# Patient Record
Sex: Female | Born: 1965 | Race: White | Hispanic: No | Marital: Married | Smoking: Never smoker
Health system: Southern US, Community
[De-identification: ages and names within clinical notes are randomized; demographics above are authoritative.]

## PROBLEM LIST (undated history)

## (undated) DIAGNOSIS — M19072 Primary osteoarthritis, left ankle and foot: Secondary | ICD-10-CM

## (undated) DIAGNOSIS — I8390 Asymptomatic varicose veins of unspecified lower extremity: Secondary | ICD-10-CM

## (undated) DIAGNOSIS — Z973 Presence of spectacles and contact lenses: Secondary | ICD-10-CM

## (undated) DIAGNOSIS — M1990 Unspecified osteoarthritis, unspecified site: Secondary | ICD-10-CM

## (undated) DIAGNOSIS — E7800 Pure hypercholesterolemia, unspecified: Secondary | ICD-10-CM

## (undated) DIAGNOSIS — M5126 Other intervertebral disc displacement, lumbar region: Secondary | ICD-10-CM

## (undated) DIAGNOSIS — G4730 Sleep apnea, unspecified: Secondary | ICD-10-CM

## (undated) DIAGNOSIS — I872 Venous insufficiency (chronic) (peripheral): Secondary | ICD-10-CM

## (undated) DIAGNOSIS — Q2112 Patent foramen ovale: Secondary | ICD-10-CM

## (undated) DIAGNOSIS — M25562 Pain in left knee: Secondary | ICD-10-CM

## (undated) DIAGNOSIS — I890 Lymphedema, not elsewhere classified: Secondary | ICD-10-CM

## (undated) DIAGNOSIS — R011 Cardiac murmur, unspecified: Secondary | ICD-10-CM

## (undated) DIAGNOSIS — D271 Benign neoplasm of left ovary: Secondary | ICD-10-CM

## (undated) DIAGNOSIS — I639 Cerebral infarction, unspecified: Secondary | ICD-10-CM

## (undated) DIAGNOSIS — K219 Gastro-esophageal reflux disease without esophagitis: Secondary | ICD-10-CM

## (undated) DIAGNOSIS — Z8719 Personal history of other diseases of the digestive system: Secondary | ICD-10-CM

## (undated) DIAGNOSIS — E559 Vitamin D deficiency, unspecified: Secondary | ICD-10-CM

## (undated) HISTORY — PX: DIAGNOSTIC LAPAROSCOPY: SUR761

## (undated) HISTORY — PX: ABDOMINAL HYSTERECTOMY: SHX81

## (undated) HISTORY — PX: NOVASURE ABLATION: SHX5394

---

## 1983-02-27 HISTORY — PX: TONSILLECTOMY: SUR1361

## 1991-02-27 HISTORY — PX: CHOLECYSTECTOMY: SHX55

## 2005-04-06 ENCOUNTER — Ambulatory Visit: Payer: Self-pay | Admitting: Family Medicine

## 2006-04-26 ENCOUNTER — Ambulatory Visit: Payer: Self-pay | Admitting: Obstetrics and Gynecology

## 2007-04-29 ENCOUNTER — Ambulatory Visit: Payer: Self-pay | Admitting: Obstetrics and Gynecology

## 2008-03-15 ENCOUNTER — Ambulatory Visit: Payer: Self-pay | Admitting: Obstetrics and Gynecology

## 2008-03-23 ENCOUNTER — Ambulatory Visit: Payer: Self-pay | Admitting: Obstetrics and Gynecology

## 2008-04-30 ENCOUNTER — Ambulatory Visit: Payer: Self-pay | Admitting: Obstetrics and Gynecology

## 2009-05-02 ENCOUNTER — Ambulatory Visit: Payer: Self-pay | Admitting: Obstetrics and Gynecology

## 2009-05-07 ENCOUNTER — Ambulatory Visit: Payer: Self-pay | Admitting: Obstetrics and Gynecology

## 2009-05-23 ENCOUNTER — Ambulatory Visit: Payer: Self-pay | Admitting: Obstetrics and Gynecology

## 2010-05-05 ENCOUNTER — Ambulatory Visit: Payer: Self-pay | Admitting: Obstetrics and Gynecology

## 2010-05-21 ENCOUNTER — Ambulatory Visit: Payer: Self-pay | Admitting: Bariatrics

## 2010-05-28 ENCOUNTER — Ambulatory Visit: Payer: Self-pay | Admitting: Cardiovascular Disease

## 2010-06-04 ENCOUNTER — Ambulatory Visit: Payer: Self-pay | Admitting: Bariatrics

## 2010-06-28 ENCOUNTER — Ambulatory Visit: Payer: Self-pay | Admitting: Bariatrics

## 2010-07-21 ENCOUNTER — Ambulatory Visit: Payer: Self-pay | Admitting: Internal Medicine

## 2010-09-28 HISTORY — PX: ESOPHAGOGASTRIC FUNDOPLASTY: SUR458

## 2010-12-28 HISTORY — PX: LAPAROSCOPIC GASTRIC SLEEVE RESECTION: SHX5895

## 2011-05-07 ENCOUNTER — Ambulatory Visit: Payer: Self-pay | Admitting: Obstetrics and Gynecology

## 2012-05-12 ENCOUNTER — Ambulatory Visit: Payer: Self-pay | Admitting: Obstetrics and Gynecology

## 2013-04-05 HISTORY — PX: HAMMER TOE SURGERY: SHX385

## 2013-05-16 DIAGNOSIS — E559 Vitamin D deficiency, unspecified: Secondary | ICD-10-CM | POA: Insufficient documentation

## 2013-05-18 ENCOUNTER — Ambulatory Visit: Payer: Self-pay | Admitting: Family Medicine

## 2014-03-22 DIAGNOSIS — R011 Cardiac murmur, unspecified: Secondary | ICD-10-CM | POA: Insufficient documentation

## 2014-04-30 ENCOUNTER — Ambulatory Visit: Payer: Self-pay | Admitting: Unknown Physician Specialty

## 2014-04-30 HISTORY — PX: COLONOSCOPY: SHX174

## 2014-05-01 LAB — PATHOLOGY REPORT

## 2014-05-22 ENCOUNTER — Ambulatory Visit: Payer: Self-pay | Admitting: Family Medicine

## 2014-05-31 ENCOUNTER — Ambulatory Visit: Payer: Self-pay | Admitting: Family Medicine

## 2015-01-18 ENCOUNTER — Ambulatory Visit
Admit: 2015-01-18 | Disposition: A | Discharge status: Home / Self Care | Payer: Self-pay | Attending: Family Medicine | Admitting: Family Medicine

## 2015-01-18 LAB — URINALYSIS, COMPLETE
Bilirubin,UR: NEGATIVE
Glucose,UR: NEGATIVE
NITRITE: NEGATIVE
PH: 5.5 (ref 5.0–8.0)

## 2015-02-11 DIAGNOSIS — M67479 Ganglion, unspecified ankle and foot: Secondary | ICD-10-CM | POA: Insufficient documentation

## 2015-02-11 DIAGNOSIS — M2041 Other hammer toe(s) (acquired), right foot: Secondary | ICD-10-CM | POA: Insufficient documentation

## 2015-03-26 ENCOUNTER — Other Ambulatory Visit: Payer: Self-pay | Admitting: Family Medicine

## 2015-03-26 DIAGNOSIS — M79662 Pain in left lower leg: Secondary | ICD-10-CM

## 2015-03-28 ENCOUNTER — Ambulatory Visit
Admission: RE | Admit: 2015-03-28 | Discharge: 2015-03-28 | Disposition: A | Discharge status: Home / Self Care | Payer: BC Managed Care – PPO | Source: Ambulatory Visit | Attending: Family Medicine | Admitting: Family Medicine

## 2015-03-28 ENCOUNTER — Ambulatory Visit: Payer: Self-pay

## 2015-03-28 DIAGNOSIS — M79662 Pain in left lower leg: Secondary | ICD-10-CM

## 2015-03-28 DIAGNOSIS — M79605 Pain in left leg: Secondary | ICD-10-CM | POA: Diagnosis not present

## 2015-05-29 ENCOUNTER — Other Ambulatory Visit: Payer: Self-pay | Admitting: Family Medicine

## 2015-05-29 DIAGNOSIS — Z1231 Encounter for screening mammogram for malignant neoplasm of breast: Secondary | ICD-10-CM

## 2015-06-04 DIAGNOSIS — M25562 Pain in left knee: Secondary | ICD-10-CM | POA: Insufficient documentation

## 2015-06-04 DIAGNOSIS — M1712 Unilateral primary osteoarthritis, left knee: Secondary | ICD-10-CM | POA: Insufficient documentation

## 2015-06-11 ENCOUNTER — Other Ambulatory Visit: Payer: Self-pay | Admitting: Orthopedic Surgery

## 2015-06-11 DIAGNOSIS — M25562 Pain in left knee: Secondary | ICD-10-CM

## 2015-06-18 ENCOUNTER — Ambulatory Visit
Admission: RE | Admit: 2015-06-18 | Discharge: 2015-06-18 | Disposition: A | Discharge status: Home / Self Care | Payer: BC Managed Care – PPO | Source: Ambulatory Visit | Attending: Family Medicine | Admitting: Family Medicine

## 2015-06-18 DIAGNOSIS — Z1231 Encounter for screening mammogram for malignant neoplasm of breast: Secondary | ICD-10-CM | POA: Insufficient documentation

## 2015-06-20 ENCOUNTER — Ambulatory Visit
Admission: RE | Admit: 2015-06-20 | Discharge: 2015-06-20 | Disposition: A | Discharge status: Home / Self Care | Payer: BC Managed Care – PPO | Source: Ambulatory Visit | Attending: Orthopedic Surgery | Admitting: Orthopedic Surgery

## 2015-06-20 DIAGNOSIS — S83232A Complex tear of medial meniscus, current injury, left knee, initial encounter: Secondary | ICD-10-CM | POA: Diagnosis not present

## 2015-06-20 DIAGNOSIS — X58XXXA Exposure to other specified factors, initial encounter: Secondary | ICD-10-CM | POA: Diagnosis not present

## 2015-06-20 DIAGNOSIS — M25562 Pain in left knee: Secondary | ICD-10-CM | POA: Insufficient documentation

## 2015-06-20 DIAGNOSIS — M1711 Unilateral primary osteoarthritis, right knee: Secondary | ICD-10-CM | POA: Diagnosis not present

## 2015-07-03 DIAGNOSIS — S83209A Unspecified tear of unspecified meniscus, current injury, unspecified knee, initial encounter: Secondary | ICD-10-CM | POA: Insufficient documentation

## 2015-11-12 ENCOUNTER — Encounter: Payer: Self-pay | Admitting: *Deleted

## 2015-11-22 ENCOUNTER — Encounter
Admission: RE | Disposition: A | Discharge status: Home / Self Care | Payer: Self-pay | Source: Ambulatory Visit | Attending: Unknown Physician Specialty

## 2015-11-22 ENCOUNTER — Ambulatory Visit: Payer: BC Managed Care – PPO | Admitting: Anesthesiology

## 2015-11-22 ENCOUNTER — Ambulatory Visit
Admission: RE | Admit: 2015-11-22 | Discharge: 2015-11-22 | Disposition: A | Discharge status: Home / Self Care | Payer: BC Managed Care – PPO | Source: Ambulatory Visit | Attending: Unknown Physician Specialty | Admitting: Unknown Physician Specialty

## 2015-11-22 DIAGNOSIS — Z882 Allergy status to sulfonamides status: Secondary | ICD-10-CM | POA: Insufficient documentation

## 2015-11-22 DIAGNOSIS — Z9889 Other specified postprocedural states: Secondary | ICD-10-CM | POA: Diagnosis not present

## 2015-11-22 DIAGNOSIS — Z79899 Other long term (current) drug therapy: Secondary | ICD-10-CM | POA: Insufficient documentation

## 2015-11-22 DIAGNOSIS — E559 Vitamin D deficiency, unspecified: Secondary | ICD-10-CM | POA: Diagnosis not present

## 2015-11-22 DIAGNOSIS — M948X6 Other specified disorders of cartilage, lower leg: Secondary | ICD-10-CM | POA: Insufficient documentation

## 2015-11-22 DIAGNOSIS — Z9049 Acquired absence of other specified parts of digestive tract: Secondary | ICD-10-CM | POA: Insufficient documentation

## 2015-11-22 DIAGNOSIS — M1712 Unilateral primary osteoarthritis, left knee: Secondary | ICD-10-CM | POA: Diagnosis not present

## 2015-11-22 DIAGNOSIS — Z887 Allergy status to serum and vaccine status: Secondary | ICD-10-CM | POA: Insufficient documentation

## 2015-11-22 DIAGNOSIS — R011 Cardiac murmur, unspecified: Secondary | ICD-10-CM | POA: Diagnosis not present

## 2015-11-22 DIAGNOSIS — Z841 Family history of disorders of kidney and ureter: Secondary | ICD-10-CM | POA: Diagnosis not present

## 2015-11-22 DIAGNOSIS — M19072 Primary osteoarthritis, left ankle and foot: Secondary | ICD-10-CM | POA: Insufficient documentation

## 2015-11-22 DIAGNOSIS — S83242A Other tear of medial meniscus, current injury, left knee, initial encounter: Secondary | ICD-10-CM | POA: Diagnosis present

## 2015-11-22 DIAGNOSIS — Z8249 Family history of ischemic heart disease and other diseases of the circulatory system: Secondary | ICD-10-CM | POA: Insufficient documentation

## 2015-11-22 DIAGNOSIS — Z803 Family history of malignant neoplasm of breast: Secondary | ICD-10-CM | POA: Diagnosis not present

## 2015-11-22 HISTORY — DX: Presence of spectacles and contact lenses: Z97.3

## 2015-11-22 HISTORY — DX: Cardiac murmur, unspecified: R01.1

## 2015-11-22 HISTORY — DX: Unspecified osteoarthritis, unspecified site: M19.90

## 2015-11-22 HISTORY — PX: KNEE ARTHROSCOPY: SHX127

## 2015-11-22 SURGERY — ARTHROSCOPY, KNEE
Anesthesia: General | Site: Knee | Laterality: Left | Wound class: Clean

## 2015-11-22 MED ORDER — MIDAZOLAM HCL 5 MG/5ML IJ SOLN
INTRAMUSCULAR | Status: DC | PRN
  Administered 2015-11-22: 2 mg via INTRAVENOUS

## 2015-11-22 MED ORDER — LIDOCAINE HCL (CARDIAC) 20 MG/ML IV SOLN
INTRAVENOUS | Status: DC | PRN
  Administered 2015-11-22: 30 mg via INTRATRACHEAL

## 2015-11-22 MED ORDER — NORCO 5-325 MG PO TABS: 1.0000 | ORAL_TABLET | Freq: Four times a day (QID) | ORAL | Status: DC | PRN

## 2015-11-22 MED ORDER — BUPIVACAINE HCL (PF) 0.5 % IJ SOLN
INTRAMUSCULAR | Status: DC | PRN
  Administered 2015-11-22: 20 mL

## 2015-11-22 MED ORDER — PROPOFOL 10 MG/ML IV BOLUS
INTRAVENOUS | Status: DC | PRN
  Administered 2015-11-22: 200 mg via INTRAVENOUS

## 2015-11-22 MED ORDER — OXYCODONE HCL 5 MG/5ML PO SOLN
5.0000 mg | Freq: Once | ORAL | Status: DC | PRN
Start: 2015-11-22 — End: 2015-11-22

## 2015-11-22 MED ORDER — OXYCODONE HCL 5 MG PO TABS: 5.0000 mg | ORAL_TABLET | Freq: Once | ORAL | Status: DC | PRN

## 2015-11-22 MED ORDER — PROMETHAZINE HCL 25 MG/ML IJ SOLN: 6.2500 mg | INTRAMUSCULAR | Status: DC | PRN

## 2015-11-22 MED ORDER — FENTANYL CITRATE (PF) 100 MCG/2ML IJ SOLN: 25.0000 ug | INTRAMUSCULAR | Status: DC | PRN

## 2015-11-22 MED ORDER — FENTANYL CITRATE (PF) 100 MCG/2ML IJ SOLN
INTRAMUSCULAR | Status: DC | PRN
  Administered 2015-11-22 (×4): 50 ug via INTRAVENOUS

## 2015-11-22 MED ORDER — LACTATED RINGERS IV SOLN
INTRAVENOUS | Status: DC
  Administered 2015-11-22: 07:00:00 via INTRAVENOUS

## 2015-11-22 MED ORDER — KETOROLAC TROMETHAMINE 15 MG/ML IJ SOLN
INTRAMUSCULAR | Status: DC | PRN
  Administered 2015-11-22: 30 mg via INTRAVENOUS

## 2015-11-22 MED ORDER — DEXAMETHASONE SODIUM PHOSPHATE 4 MG/ML IJ SOLN
INTRAMUSCULAR | Status: DC | PRN
  Administered 2015-11-22: 4 mg via INTRAVENOUS

## 2015-11-22 MED ORDER — ONDANSETRON HCL 4 MG/2ML IJ SOLN
INTRAMUSCULAR | Status: DC | PRN
  Administered 2015-11-22: 4 mg via INTRAVENOUS

## 2015-11-22 SURGICAL SUPPLY — 43 items
ARTHROWAND PARAGON T2 (SURGICAL WAND)
BLADE ABRADER 4.5 (BLADE) ×3 IMPLANT
BLADE FULL RADIUS 3.5 (BLADE) IMPLANT
BLADE SHAVER 4.5X7 STR FR (MISCELLANEOUS) ×3 IMPLANT
BLADE SHAVER AGGRES 5.5  STR (CUTTER)
BLADE SHAVER AGGRES 5.5 STR (CUTTER) IMPLANT
BNDG ESMARK 6X12 TAN STRL LF (GAUZE/BANDAGES/DRESSINGS) IMPLANT
BUR 5.5 NOTCHBLASTER STR (BURR) IMPLANT
BUR ABRADER 4.0 W/FLUTE AQUA (MISCELLANEOUS) IMPLANT
BUR ABRADER 5.5 BLK (MISCELLANEOUS) IMPLANT
BUR ACROMIONIZER 4.0 (BURR) IMPLANT
BUR BR 5.5 WIDE MOUTH (BURR) IMPLANT
BURR 5.5 NOTCHBLASTER STR (BURR)
BURR ABRADER 4.0 W/FLUTE AQUA (MISCELLANEOUS)
BURR ABRADER 5.5 BLK (MISCELLANEOUS)
COVER LIGHT HANDLE UNIVERSAL (MISCELLANEOUS) ×6 IMPLANT
CUFF TOURN SGL QUICK 34 (TOURNIQUET CUFF) ×2
CUFF TRNQT CYL 34X4X40X1 (TOURNIQUET CUFF) ×1 IMPLANT
DRAPE LEGGINS SURG 28X43 STRL (DRAPES) ×3 IMPLANT
DURAPREP 26ML APPLICATOR (WOUND CARE) ×3 IMPLANT
GAUZE SPONGE 4X4 12PLY STRL (GAUZE/BANDAGES/DRESSINGS) ×3 IMPLANT
GLOVE BIO SURGEON STRL SZ7.5 (GLOVE) ×6 IMPLANT
GLOVE BIO SURGEON STRL SZ8 (GLOVE) ×3 IMPLANT
GLOVE INDICATOR 8.0 STRL GRN (GLOVE) ×6 IMPLANT
GOWN STRL REIN 2XL XLG LVL4 (GOWN DISPOSABLE) ×6 IMPLANT
GOWN STRL REUS W/TWL 2XL LVL3 (GOWN DISPOSABLE) IMPLANT
IV LACTATED RINGER IRRG 3000ML (IV SOLUTION) ×4
IV LR IRRIG 3000ML ARTHROMATIC (IV SOLUTION) ×2 IMPLANT
KIT ROOM TURNOVER OR (KITS) ×3 IMPLANT
MANIFOLD 4PT FOR NEPTUNE1 (MISCELLANEOUS) ×3 IMPLANT
PACK ARTHROSCOPY KNEE (MISCELLANEOUS) ×3 IMPLANT
SET TUBE SUCT SHAVER OUTFL 24K (TUBING) ×3 IMPLANT
SOL PREP PVP 2OZ (MISCELLANEOUS) ×3
SOLUTION PREP PVP 2OZ (MISCELLANEOUS) ×1 IMPLANT
SUT ETHILON 3-0 FS-10 30 BLK (SUTURE) ×3
SUTURE EHLN 3-0 FS-10 30 BLK (SUTURE) ×1 IMPLANT
TAPE MICROFOAM 4IN (TAPE) ×3 IMPLANT
TUBING ARTHRO INFLOW-ONLY STRL (TUBING) ×3 IMPLANT
WAND ARTHRO PARAGON T2 (SURGICAL WAND) IMPLANT
WAND HAND CNTRL MULTIVAC 50 (MISCELLANEOUS) IMPLANT
WAND HAND CNTRL MULTIVAC 90 (MISCELLANEOUS) IMPLANT
WAND MEGAVAC 90 (MISCELLANEOUS) IMPLANT
WRAP KNEE W/COLD PACKS 25.5X14 (SOFTGOODS) ×3 IMPLANT

## 2015-11-22 NOTE — Anesthesia Postprocedure Evaluation (Signed)
Anesthesia Post Note  Patient: Shari Parks  Procedure(s) Performed: Procedure(s) (LRB): ARTHROSCOPY KNEE WITH CHONDROPLASTY MEDIAL FEMORAL CONDYLE (Left)  Patient location during evaluation: PACU Anesthesia Type: General Level of consciousness: awake and alert Pain management: pain level controlled Vital Signs Assessment: post-procedure vital signs reviewed and stable Respiratory status: spontaneous breathing, nonlabored ventilation, respiratory function stable and patient connected to nasal cannula oxygen Cardiovascular status: blood pressure returned to baseline and stable Postop Assessment: no signs of nausea or vomiting Anesthetic complications: no    Arleen Bar C

## 2015-11-22 NOTE — Op Note (Signed)
Patient: Shari Parks, Shari Parks  Preoperative diagnosis: Torn medial meniscus left knee plus medial compartment chondral changes  Postop diagnosis: Large grade 4 chondral lesion medial femoral condyle left knee  Operation: Arthroscopic chondroplasty left knee  Surgeon: Vilinda Flake, MD  Anesthesia: Gen.   History: Patient's had a long history of left knee pain.  The plain films revealed minimal medial compartment narrowing .  The patient had an MRI which revealed probable torn medial meniscus and medial compartment chondral thinning.The patient was scheduled for surgery due to persistent discomfort despite conservative treatment.  The patient was taken the operating room where satisfactory general anesthesia was achieved. A tourniquet and leg holder were was applied to the left thigh. A well leg support was applied to the nonoperative extremity. The left knee was prepped and draped in usual fashion for an arthroscopic procedure. An inflow cannula was introduced superomedially. The joint was distended with lactated Ringer's. Scope was introduced through an inferolateral puncture wound and a probe through an inferomedial puncture wound. Inspection of the medial compartment revealed  basically a grade 4 medial femoral chondral lesion that involved the medial one half of the medial femoral condyle. I went ahead and debrided the edges of the chondral lesion with a small full radius synovial resector. I did probe the medial meniscus and no significant tear was appreciated. There was a little irregularity of the posterior horn of the medial meniscus, however. Inspection of the intercondylar notch revealed intact cruciates. Inspection of the the lateral compartment revealed no significant meniscal or chondral pathology.   Trochlear groove was inspected and appeared to be fairly smooth.  Retropatellar surface was smooth.The patella seemed to track fairly well.  The instruments were removed from the joint  at this time. The puncture wounds were closed with 3-0 nylon in vertical mattress fashion. I injected each puncture wound with several cc of half percent Marcaine without epinephrine. Betadine was applied the wounds followed by sterile dressing. An ice pack was applied to the right knee. The patient was awakened and transferred to the stretcher bed. The patient was taken to the recovery room in satisfactory condition.  The tourniquet was not inflated during the course of the procedure. Blood loss was negligible.

## 2015-11-22 NOTE — Anesthesia Preprocedure Evaluation (Signed)
Anesthesia Evaluation  Patient identified by MRN, date of birth, ID band Patient awake    Reviewed: Allergy & Precautions, NPO status , Patient's Chart, lab work & pertinent test results  Airway Mallampati: II  TM Distance: >3 FB Neck ROM: Full    Dental no notable dental hx.    Pulmonary neg pulmonary ROS,    Pulmonary exam normal breath sounds clear to auscultation       Cardiovascular Normal cardiovascular exam+ Valvular Problems/Murmurs  Rhythm:Regular Rate:Normal + Systolic murmurs Heart murmur "slight and followed by PCP"   Neuro/Psych negative neurological ROS  negative psych ROS   GI/Hepatic negative GI ROS, Neg liver ROS,   Endo/Other  negative endocrine ROS  Renal/GU negative Renal ROS  negative genitourinary   Musculoskeletal  (+) Arthritis , Osteoarthritis,    Abdominal   Peds negative pediatric ROS (+)  Hematology negative hematology ROS (+)   Anesthesia Other Findings   Reproductive/Obstetrics negative OB ROS                             Anesthesia Physical Anesthesia Plan  ASA: II  Anesthesia Plan: General   Post-op Pain Management:    Induction: Intravenous  Airway Management Planned: LMA  Additional Equipment:   Intra-op Plan:   Post-operative Plan: Extubation in OR  Informed Consent: I have reviewed the patients History and Physical, chart, labs and discussed the procedure including the risks, benefits and alternatives for the proposed anesthesia with the patient or authorized representative who has indicated his/her understanding and acceptance.   Dental advisory given  Plan Discussed with: CRNA  Anesthesia Plan Comments:         Anesthesia Quick Evaluation

## 2015-11-22 NOTE — H&P (Signed)
  H and P reviewed. No changes. Uploaded at later date. 

## 2015-11-22 NOTE — Anesthesia Procedure Notes (Signed)
Procedure Name: LMA Insertion Performed by: Nat Christen Pre-anesthesia Checklist: Patient identified, Patient being monitored, Timeout performed, Emergency Drugs available and Suction available Patient Re-evaluated:Patient Re-evaluated prior to inductionOxygen Delivery Method: Circle system utilized Preoxygenation: Pre-oxygenation with 100% oxygen Intubation Type: IV induction Ventilation: Mask ventilation without difficulty LMA: LMA inserted LMA Size: 4.0 Tube type: Oral Number of attempts: 1 Placement Confirmation: positive ETCO2 and breath sounds checked- equal and bilateral Tube secured with: Tape Dental Injury: Teeth and Oropharynx as per pre-operative assessment

## 2015-11-22 NOTE — Transfer of Care (Signed)
Immediate Anesthesia Transfer of Care Note  Patient: Shari Parks  Procedure(s) Performed: Procedure(s) with comments: ARTHROSCOPY KNEE WITH CHONDROPLASTY MEDIAL FEMORAL CONDYLE (Left) - order of cases moved per CeCe and Dr. Leslye Peer  Patient Location: PACU  Anesthesia Type: General  Level of Consciousness: awake, alert  and patient cooperative  Airway and Oxygen Therapy: Patient Spontanous Breathing and Patient connected to supplemental oxygen  Post-op Assessment: Post-op Vital signs reviewed, Patient's Cardiovascular Status Stable, Respiratory Function Stable, Patent Airway and No signs of Nausea or vomiting  Post-op Vital Signs: Reviewed and stable  Complications: No apparent anesthesia complications

## 2015-11-22 NOTE — Discharge Instructions (Signed)
General Anesthesia, Adult, Care After °Refer to this sheet in the next few weeks. These instructions provide you with information on caring for yourself after your procedure. Your health care provider may also give you more specific instructions. Your treatment has been planned according to current medical practices, but problems sometimes occur. Call your health care provider if you have any problems or questions after your procedure. °WHAT TO EXPECT AFTER THE PROCEDURE °After the procedure, it is typical to experience: °· Sleepiness. °· Nausea and vomiting. °HOME CARE INSTRUCTIONS °· For the first 24 hours after general anesthesia: °¨ Have a responsible person with you. °¨ Do not drive a car. If you are alone, do not take public transportation. °¨ Do not drink alcohol. °¨ Do not take medicine that has not been prescribed by your health care provider. °¨ Do not sign important papers or make important decisions. °¨ You may resume a normal diet and activities as directed by your health care provider. °· Change bandages (dressings) as directed. °· If you have questions or problems that seem related to general anesthesia, call the hospital and ask for the anesthetist or anesthesiologist on call. °SEEK MEDICAL CARE IF: °· You have nausea and vomiting that continue the day after anesthesia. °· You develop a rash. °SEEK IMMEDIATE MEDICAL CARE IF:  °· You have difficulty breathing. °· You have chest pain. °· You have any allergic problems. °  °This information is not intended to replace advice given to you by your health care provider. Make sure you discuss any questions you have with your health care provider. °  °Document Released: 12/21/2000 Document Revised: 10/05/2014 Document Reviewed: 01/13/2012 °Elsevier Interactive Patient Education ©2016 [employer] ° ° °[facility]  °Karis Rilling B. Leeroy Lovings, Jr., M.D. [phone]  ° °KNEE ARTHROSCOPY POST OPERATION INSTRUCTIONS: ° °PLEASE  READ THESE INSTRUCTIONS ABOUT POST OPERATION CARE. THEY WILL ANSWER MOST OF YOUR QUESTIONS.  °You have been given a prescription for pain. Please take as directed for pain.  °You can walk, keeping the knee slightly stiff-avoid doing too much bending the first day. (if ACL reconstruction is performed, keep brace locked in extension when walking.)  °You will use crutches or cane if needed. Can weight bear as tolerated  °Plan to take three to four days off from work. You can resume work when you are comfortable. (This can be a week or more, depending on the type of work you do.)  °To reduce pain and swelling, place one to two pillows under the knee the first two or three days when sitting or lying. An ice pack may be placed on top of the area over the dressing. Instructions for making homemade icepack are as follow:  °Flexible homemade alcohol water ice pack  °2 cups water  °1 cup rubbing alcohol  °food coloring for the blue tint (optional)  °2 zip-top bags - gallon-size  °Mix the water and alcohol together in one of your zip-top bags and add food coloring. Release as much air as possible and seal the bag. Place in freezer for at least 12 hours.  °The small incisions in your knee are closed with nylon stitches. They will be removed in the office.  °The bulky dressing may be removed in the third day after surgery. (If ACL surgery-DO NOT REMOVE BANDAGES). Put a waterproof band-aid over each stitch. Do not put any creams or ointments on wounds. You may shower at this time, but change waterproof band-aids after showering. KEEP INCISIONS CLEAN   AND DRY UNTIL YOU RETURN TO THE OFFICE.  °Sometimes the operative area remains somewhat painful and swollen for several weeks. This is usually nothing to worry about, but call if you have any excessive symptoms, especially fever. It is not unusual to have a low grade fever of 99 degrees for the first few days. If persist after 3-4 days call the office. It is not uncommon for the pain  to be a little worse on the third day after surgery.  °Begin doing gentle exercises right away. They will be limited by the amount of pain and swelling you have.  Exercising will reduce the swelling, increase motion, and prevent muscle weakness. Exercises: Straight leg raising and gentle knee bending.  °Take 81 milligram aspirin twice a day for 2 weeks after meals or milk. This along with elevation will help reduce the possibility of phlebitis in your operated leg.  °Avoid strenuous athletics for a minimum of 4 to 6 weeks after arthroscopic surgery (approximately five months if ACL surgery).  °If the surgery included ACL reconstruction the brace that is supplied to the extremity post surgery is to be locked in extension when you are asleep and is to be locked in extension when you are ambulating. It can be unlocked for exercises or sitting.  °Keep your post surgery appointment that has been made for you. If you do not remember the date call [phone]. Your follow up appointment should be between 7-10 days.  ° °

## 2015-11-25 ENCOUNTER — Encounter: Payer: Self-pay | Admitting: Unknown Physician Specialty

## 2015-11-28 DIAGNOSIS — M65342 Trigger finger, left ring finger: Secondary | ICD-10-CM | POA: Insufficient documentation

## 2015-12-19 DIAGNOSIS — M79674 Pain in right toe(s): Secondary | ICD-10-CM | POA: Insufficient documentation

## 2016-04-02 ENCOUNTER — Ambulatory Visit: Payer: BC Managed Care – PPO | Attending: Family Medicine | Admitting: Occupational Therapy

## 2016-04-02 ENCOUNTER — Encounter: Payer: Self-pay | Admitting: Occupational Therapy

## 2016-04-02 DIAGNOSIS — I890 Lymphedema, not elsewhere classified: Secondary | ICD-10-CM | POA: Diagnosis not present

## 2016-04-02 NOTE — Patient Instructions (Signed)

## 2016-04-02 NOTE — Therapy (Signed)
[place] MAIN [facility] SERVICES [address] [place], [place], [address] Phone: [phone]   Fax:  [phone]  Occupational Therapy Evaluation  Patient Details  Name: Shari Parks MRN: [id number] Date of Birth: 1966-05-30 Referring Provider: Salome Holmes, MD  Encounter Date: 04/02/2016      OT End of Session - 04/02/16 0946    Visit Number 1   Number of Visits 36   Date for OT Re-Evaluation 07/01/16   OT Start Time 0805   OT Stop Time 0910   OT Time Calculation (min) 65 min   Equipment Utilized During Treatment Lymphedema Self Care Workbook   Activity Tolerance Patient tolerated treatment well;No increased pain   Behavior During Therapy [facility] for tasks assessed/performed      Past Medical History  Diagnosis Date  . Heart murmur     "slight" - followed by PCP  . Wears contact lenses   . Arthritis     left knee    Past Surgical History  Procedure Laterality Date  . Novasure ablation  approx 2011  . Colonoscopy  04/30/14  . Esophagogastric fundoplasty  2012  . Tonsillectomy  6/84  . Cholecystectomy  6/92  . Hammer toe surgery Right 04/05/13  . Knee arthroscopy Left 11/22/2015    Procedure: ARTHROSCOPY KNEE WITH CHONDROPLASTY MEDIAL FEMORAL CONDYLE;  Surgeon: Leanor Kail, MD;  Location: [place];  Service: Orthopedics;  Laterality: Left;  order of cases moved per CeCe and Dr. Leslye Peer    There were no vitals filed for this visit.      Subjective Assessment - 04/02/16 0914    Subjective  Shari Parks is referred for OT evaluation and treatment of BLE lymphedema by Augustine Radar, MD. She reports she has had swelling in her legs for years, but noticed that swelling stop reducing w/ hours of sleep elevation over the past year and a half. Pt has explored lymphedema treatment in the past, but has not participated in Complete Decongestive Therapy to date. She denies prior episodes of cellulitis, and states she  has been  unsuccessful with compression garments to date.  Pt  uses a sequential pneumatic compression device (32 chamber Flexitouch pump) at home 2-3 x weekly. 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   Pertinent History s/p L knee arthroscopy 2/ 2017;  novasure ablation 2011 at [facility];  s/p Esophagastric Fundoplasty 2012, R hammer toe sx 03/2013   Limitations difficulty walking;  decreased standing  tolerance, decreased activiy tolerance; leg pain, leg swelling,    Patient Stated Goals get the swelling down and decrease leg pain to increase activity level   Currently in Pain? 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 Did not persue CDT   Balance Screen   Has the patient fallen in the past 6 months No   Home  Environment   Lives With Spouse   Prior Function   Level of Independence Independent with basic ADLs;Independent with household mobility without device;Independent with community mobility without device;Independent with homemaking with ambulation;Independent with gait;Independent with transfers   IADL   Shopping Takes care of all shopping needs independently   Light Housekeeping  Does personal laundry completely;Maintains house alone or with occasional assistance   Meal Prep Plans, prepares and serves adequate meals independently   Programmer, applications own vehicle   Mobility   Mobility Status Independent   Cognition   Overall Cognitive Status Within Functional Limits for tasks assessed   Observation/Other Assessments   Skin Integrity BLE leg swelling concentrated below knees, R>L.  Thighs w/ excess doughy adipose. Swelling and fat distribution not in keeping w/ lipo-lymphedema "pantaloon-like" distribution. Skin is tight with spongey feel w/ palpation. Skin well hydrated. Feet pitting 2+.. stemmer sign +   Coordination   Gross Motor Movements are Fluid and Coordinated Yes          LYMPHEDEMA/ONCOLOGY QUESTIONNAIRE - 04/02/16 0944    What other symptoms do you have   Are you Having Heaviness or Tightness Yes   Are you having pitting edema Yes   Is it Hard or Difficult finding clothes that fit Yes   Do you have infections No   Is there Decreased scar mobility No   Stemmer Sign Yes   Lymphedema Assessments   Lymphedema Assessments Lower extremities                OT Treatments/Exercises (OP) - 04/02/16 0001    Transfers   Transfers Sit to Stand   Sit to Stand With upper extremity assist;7: Independent;Other (comment)  and extra time   Manual Therapy   Manual Therapy Edema management   Manual therapy comments BLE comparative limb volumetrics and Limb volume differential (LVD) to be assessed first Rx visit               OT Education - 04/02/16 0945    Education provided Yes   Education Details Provided Pt/caregiver skilled education and ADL training throughout visit for lymphedema etiology, progression, and treatment including Intensive and Management Phase Complete Decongestive Therapy (CDT)  Discussed lymphedema precautions, cellulitis risk, and all CDT and LE self-care components, including compression wrapping/ garments & devices,  lymphatic pumping ther ex, simple self-MLD, and skin care. Provided printed Lymphedema Workbook for reference.   Person(s) Educated Patient   Methods Explanation;Demonstration;Tactile cues;Handout   Comprehension Verbalized understanding;Need further instruction             OT Long Term Goals - 04/02/16 [id number]    OT LONG TERM GOAL #1   Title Decrease BLE limb volumes by 10% below the knees to improve functional ambulation and mobility.   Baseline dependent   Time 12   Period Weeks   Status New   OT LONG TERM GOAL #2   Title Pt able to independently apply knee length, layered, gradient compression wraps using proper techniques within to weeks to decrease leg swelling and improve functional independence in all occupational domains.   Baseline dependent    Time 2   Period Weeks   Status New   OT LONG TERM GOAL #3   Title Pt >/= 85 %  compliant with all daily, LE self-care protocols for home program, including simple self-manual lymphatic drainage (MLD), skin care, lymphatic pumping the ex, skin care, and donning/ doffing compression wraps and garments o limit LE progression and further functional decline.     Baseline dependent   Time 12   Period Weeks   Status New   OT LONG TERM GOAL #4   Title Pt to tolerate daily compression wraps, garments and devices in keeping w/ prescribed wear regime within 1 week of issue date to progress and retain clinical and functional gains and to limit LE progression.   Baseline dependent   Time 12   Period Weeks   Status New   OT LONG TERM GOAL #5   Title During Management Phase CDT Pt to sustain limb volume reductions achieved during Intensive Phase CDT within 5% utilizing LE self-care protocols, appropriate compression garments/ devices, and needed level of caregiver assistance.   Baseline dependent   Time 6   Period Months   Status New               Plan - 04/02/16 0947    Clinical Impression Statement Pt presents with mild, stage  II, BLE lower extremity lymphedema (LE) secondary suspected venous insufficiency and obesity w/ onset greater than 10 years ago.  Pt endorses worsening swelling and pain over time with acceleration over the past year and a half. BLE swelling and pain limit ambulation, functional mobility, instrumental ADLs, and participation in productive and social roles daily. Without skilled Occupational Therapy for Intensive and Management phase Complete Decongestive Therapy (CDT) to address chronic, progressive BLE LE, this patient's condition is expected to worsen and further functional decline is likely.   Rehab Potential Good   OT Frequency 3x / week  Consider decreasing to 2 x weekly when Pt able to appy compression wraps correctly in effort to limit burden of care   OT Duration Other (comment)   OT Treatment/Interventions Self-care/ADL training;DME and/or AE instruction;Manual lymph drainage;Patient/family education;Compression bandaging;Therapeutic exercises;Therapeutic activities;Manual Therapy   Plan Complete Decongestive Therapy (CDT) to include Manual Lymphatic Drainage, (MLD), compression wrapping 1 leg at a time w/ custom garments fitting when reduction achieves volumetric plateau, therapeutic lymphatic pumping exercise, skin care, LE self care training   Consulted and Agree with Plan of Care Patient;Other (Comment)  Pt will consider optimal POC. If she decides she is unable to fully participate we will suspend CDT and fit compression garments only      Patient will benefit from skilled therapeutic intervention in order to improve the following deficits and impairments:  Decreased knowledge of use of DME, Decreased skin integrity, Increased edema, Impaired flexibility, Decreased mobility, Decreased activity tolerance, Decreased knowledge of precautions, Difficulty walking, Obesity, Pain  Visit Diagnosis: Lymphedema, not elsewhere classified - Plan: Ot plan of care cert/re-cert    Problem  List There are no active problems to display for this patient.   Andrey Spearman, MS, OTR/L, [facility] 04/02/2016 10:07 AM   [place] MAIN [facility] SERVICES [address] [place], [place], [address] Phone: [phone]   Fax:  [phone]  Name: Shari Parks MRN: [id number] Date of Birth: 1966-08-15

## 2016-04-06 ENCOUNTER — Ambulatory Visit: Payer: BC Managed Care – PPO | Admitting: Occupational Therapy

## 2016-04-06 DIAGNOSIS — I890 Lymphedema, not elsewhere classified: Secondary | ICD-10-CM | POA: Diagnosis not present

## 2016-04-06 NOTE — Patient Instructions (Signed)
LE instructions and precautions as established- see initial eval.   

## 2016-04-06 NOTE — Therapy (Signed)
[place] MAIN [facility] SERVICES [address] [place], [place], [address] Phone: [phone]   Fax:  [phone]  Occupational Therapy Treatment  Patient Details  Name: Shari Parks MRN: [id number] Date of Birth: [birth year] Referring Provider: Salome Holmes, MD  Encounter Date: 04/06/2016      OT End of Session - 04/06/16 1211    Visit Number 2   Number of Visits 36   Date for OT Re-Evaluation 07/01/16   OT Start Time 0806   OT Stop Time 0925   OT Time Calculation (min) 79 min   Equipment Utilized During Treatment Lymphedema Self Care Workbook   Activity Tolerance Patient tolerated treatment well;No increased pain   Behavior During Therapy [facility] for tasks assessed/performed      Past Medical History  Diagnosis Date  . Heart murmur     "slight" - followed by PCP  . Wears contact lenses   . Arthritis     left knee    Past Surgical History  Procedure Laterality Date  . Novasure ablation  approx 2011  . Colonoscopy  04/30/14  . Esophagogastric fundoplasty  2012  . Tonsillectomy  6/84  . Cholecystectomy  6/92  . Hammer toe surgery Right 04/05/13  . Knee arthroscopy Left 11/22/2015    Procedure: ARTHROSCOPY KNEE WITH CHONDROPLASTY MEDIAL FEMORAL CONDYLE;  Surgeon: Leanor Kail, MD;  Location: [facility];  Service: Orthopedics;  Laterality: Left;  order of cases moved per CeCe and Dr. Leslye Peer    There were no vitals filed for this visit.      Subjective Assessment - 04/06/16 1159    Subjective  Pt attends OT visit 2 for modified CDT to BLE lymphedema. Pt tells me that she is opting out of compression wrap protocol to decrease lymb volume and would like to complete garment measurments ASAP. " I know me and I just wont  deal well with bandages 23/7." Pt verbalizes understanding that without  daily compression of some kind  the prognosis for limb volume reduction is poor.   Pertinent History s/p L knee arthroscopy 2/ 2017;   novasure ablation 2011 at [facility];  s/p Esophagastric Fundoplasty 2012, R hammer toe sx 03/2013   Limitations difficulty walking;  decreased standing  tolerance, decreased activiy tolerance; leg pain, leg swelling,    Patient Stated Goals get the swelling down and decrease leg pain to increase activity level   Currently in Pain? Yes  No change since evaluation. Not numerically rated   Pain Onset More than a month ago             LYMPHEDEMA/ONCOLOGY QUESTIONNAIRE - 04/06/16 1203    Right Lower Extremity Lymphedema   Other RLE below knee (A-D) limb volume = 5590.03 ml. RLE ankle to groin limb volume = 18934.85 ml   Other Limb volume differential (LVD) measures 4.05%, R>L below knee, and 7.75%, R>L, from ankle to groin   Left Lower Extremity Lymphedema   Other LLE below knee (A-D) limb volume = 5572.54 ml. LLE ankle to groin limb volume = 17572.41 ml                 OT Treatments/Exercises (OP) - 04/06/16 0001    ADLs   ADL Education Given Yes   Manual Therapy   Manual Therapy Edema management;Manual Lymphatic Drainage (MLD);Other (comment)   Other Manual Therapy Completed BLE comparative limb volumetrics  OT Education - 04/06/16 0934    Education Details sklled edu for compression garment options and recommendations,  basic lymphatic structure and function, simple self MLD using J Stroke, comparative volumetrics results   Person(s) Educated Patient   Methods Explanation;Demonstration;Handout   Comprehension Verbalized understanding             OT Long Term Goals - 04/02/16 [id number]    OT LONG TERM GOAL #1   Title Decrease BLE limb volumes by 10% below the knees to improve functional ambulation and mobility.   Baseline dependent   Time 12   Period Weeks   Status New   OT LONG TERM GOAL #2   Title Pt able to independently apply knee length, layered, gradient compression wraps using proper techniques within to weeks to decrease  leg swelling and improve functional independence in all occupational domains.   Baseline dependent    Time 2   Period Weeks   Status New   OT LONG TERM GOAL #3   Title Pt >/= 85 % compliant with all daily, LE self-care protocols for home program, including simple self-manual lymphatic drainage (MLD), skin care, lymphatic pumping the ex, skin care, and donning/ doffing compression wraps and garments o limit LE progression and further functional decline.     Baseline dependent   Time 12   Period Weeks   Status New   OT LONG TERM GOAL #4   Title Pt to tolerate daily compression wraps, garments and devices in keeping w/ prescribed wear regime within 1 week of issue date to progress and retain clinical and functional gains and to limit LE progression.   Baseline dependent   Time 12   Period Weeks   Status New   OT LONG TERM GOAL #5   Title During Management Phase CDT Pt to sustain limb volume reductions achieved during Intensive Phase CDT within 5% utilizing LE self-care protocols, appropriate compression garments/ devices, and needed level of caregiver assistance.   Baseline dependent   Time 6   Period Months   Status New               Plan - 04/06/16 1212    Clinical Impression Statement BLE comparative limb volumetrics revela minimal limb volume differential at both AD and AG landmarks , indicating that legs are basically symetrical overall. Smal volume differences are most likely due to  R dominance. Limb volume differential (LVD) measures 4.05%, R>L below knee, and 7.75%, R>L, from ankle to groin. After consideration over the weekend Pt has decided to opt out of standard CDT at this time due to burden of care and expected limitted compliance with compression wraps. Pt requests  After further discussion and education re pros and cons of traditional off the shelf elastic compression garments vs custom flat knit garments, Pt is opting for garment fitting with compression garments ASAP.  She  declines compression wrapping, and verbalizes understanding that prognosis for volume reduction without compression is poor.   Rehab Potential Good   OT Frequency 3x / week  Consider decreasing to 2 x weekly when Pt able to appy compression wraps correctly in effort to limit burden of care   OT Duration Other (comment)   OT Treatment/Interventions Self-care/ADL training;DME and/or AE instruction;Manual lymph drainage;Patient/family education;Compression bandaging;Therapeutic exercises;Therapeutic activities;Manual Therapy   Consulted and Agree with Plan of Care Patient;Other (Comment)  Pt will consider optimal POC. If she decides she is unable to fully participate we will suspend CDT and fit compression garments only  Patient will benefit from skilled therapeutic intervention in order to improve the following deficits and impairments:  Decreased knowledge of use of DME, Decreased skin integrity, Increased edema, Impaired flexibility, Decreased mobility, Decreased activity tolerance, Decreased knowledge of precautions, Difficulty walking, Obesity, Pain  Visit Diagnosis: Lymphedema, not elsewhere classified    Problem List There are no active problems to display for this patient.   Andrey Spearman, MS, OTR/L, [facility] 04/06/2016 12:17 PM  [place] MAIN [facility] SERVICES [address] [place], [place], [address] Phone: [phone]   Fax:  [phone]  Name: Shari Parks MRN: [id number] Date of Birth: Sep 13, 1966

## 2016-04-16 ENCOUNTER — Ambulatory Visit: Payer: BC Managed Care – PPO | Admitting: Occupational Therapy

## 2016-04-16 DIAGNOSIS — I890 Lymphedema, not elsewhere classified: Secondary | ICD-10-CM | POA: Diagnosis not present

## 2016-04-16 NOTE — Therapy (Signed)
[place] [facility] MAIN [facility] [address] [place], [place], [address] Phone: [phone]   Fax:  [phone]  Occupational Therapy Treatment  Patient Details  Name: Shari PaxLisa Allen Secrist MRN: [id number] Date of Birth: 03/05/1966 Referring Provider: Angus PalmsSionne George, MD  Encounter Date: 04/16/2016      OT End of Session - 04/16/16 1044    Visit Number 3   Number of Visits 36   Date for OT Re-Evaluation 07/01/16   OT Start Time 0905   OT Stop Time 1002   OT Time Calculation (min) 57 min   Equipment Utilized During Treatment Lymphedema Self Care Workbook   Activity Tolerance Patient tolerated treatment well;No increased pain   Behavior During Therapy [facility] for tasks assessed/performed      Past Medical History  Diagnosis Date  . Heart murmur     "slight" - followed by PCP  . Wears contact lenses   . Arthritis     left knee    Past Surgical History  Procedure Laterality Date  . Novasure ablation  approx 2011  . Colonoscopy  04/30/14  . Esophagogastric fundoplasty  2012  . Tonsillectomy  6/84  . Cholecystectomy  6/92  . Hammer toe surgery Right 04/05/13  . Knee arthroscopy Left 11/22/2015    Procedure: ARTHROSCOPY KNEE WITH CHONDROPLASTY MEDIAL FEMORAL CONDYLE;  Surgeon: Erin SonsHarold Kernodle, MD;  [facility];  Service: Orthopedics;  Laterality: Left;  order of cases moved per CeCe and Dr. Matilde BashGratian    There were no vitals filed for this visit.      Subjective Assessment - 04/16/16 1039    Subjective  Pt attends OT visit  3 for modified CDT to BLE lymphedema. Emphasis of visit today on custom compression garment measurements and Pt edu for LE self-care re compression.   Pertinent History s/p L knee arthroscopy 2/ 2017;  [facility];  s/p Esophagastric Fundoplasty 2012, R hammer toe sx 03/2013   Limitations difficulty walking;  decreased standing  tolerance, decreased activiy tolerance; leg pain,  leg swelling,    Patient Stated Goals get the swelling down and decrease leg pain to increase activity level   Pain Onset More than a month ago                      OT Treatments/Exercises (OP) - 04/16/16 0001    ADLs   ADL Education Given Yes   Manual Therapy   Manual Therapy Edema management   Edema Management Completed anatomic measurements for custom knee length Elvarex compression stockings                OT Education - 04/16/16 1042    Education provided Yes   Education Details Provided skilled Pt edu re compression garment measuring and fitting processes, specifications, and follow up assessment for fit and function   Person(s) Educated Patient   Methods Explanation;Demonstration   Comprehension Verbalized understanding;Need further instruction             OT Long Term Goals - 04/02/16 0952    OT LONG TERM GOAL #1   Title Decrease BLE limb volumes by 10% below the knees to improve functional ambulation and mobility.   Baseline dependent   Time 12   Period Weeks   Status New   OT LONG TERM GOAL #2   Title Pt able to independently apply knee length, layered, gradient compression wraps using proper techniques within to weeks  to decrease leg swelling and improve functional independence in all occupational domains.   Baseline dependent    Time 2   Period Weeks   Status New   OT LONG TERM GOAL #3   Title Pt >/= 85 % compliant with all daily, LE self-care protocols for home program, including simple self-manual lymphatic drainage (MLD), skin care, lymphatic pumping the ex, skin care, and donning/ doffing compression wraps and garments o limit LE progression and further functional decline.     Baseline dependent   Time 12   Period Weeks   Status New   OT LONG TERM GOAL #4   Title Pt to tolerate daily compression wraps, garments and devices in keeping w/ prescribed wear regime within 1 week of issue date to progress and retain clinical and  functional gains and to limit LE progression.   Baseline dependent   Time 12   Period Weeks   Status New   OT LONG TERM GOAL #5   Title During Management Phase CDT Pt to sustain limb volume reductions achieved during Intensive Phase CDT within 5% utilizing LE self-care protocols, appropriate compression garments/ devices, and needed level of caregiver assistance.   Baseline dependent   Time 6   Period Months   Status New               Plan - 04/16/16 1044    Clinical Impression Statement Completed BLE anatomical measurements for custom, flat knit, ccl3, Jobst ELVAREX, knee length compression stockings. Custom specifications include slat open toe, oblique top edhe, T heel, tricot patch stitched on all 4 sides at anterior CY landmark,  and oblique top edge. Garments  will be shipped directly to patient. She will wear for a couple of days  and schedule appointment for fitting a couple of days after they arrive.   Rehab Potential Good   OT Frequency 3x / week  Consider decreasing to 2 x weekly when Pt able to appy compression wraps correctly in effort to limit burden of care   OT Duration Other (comment)   OT Treatment/Interventions Self-care/ADL training;DME and/or AE instruction;Manual lymph drainage;Patient/family education;Compression bandaging;Therapeutic exercises;Therapeutic activities;Manual Therapy   Consulted and Agree with Plan of Care Patient;Other (Comment)  Pt will consider optimal POC. If she decides she is unable to fully participate we will suspend CDT and fit compression garments only      Patient will benefit from skilled therapeutic intervention in order to improve the following deficits and impairments:  Decreased knowledge of use of DME, Decreased skin integrity, Increased edema, Impaired flexibility, Decreased mobility, Decreased activity tolerance, Decreased knowledge of precautions, Difficulty walking, Obesity, Pain  Visit Diagnosis: Lymphedema, not elsewhere  classified    Problem List There are no active problems to display for this patient.   Andrey Spearman, MS, OTR/L, [facility] 04/16/2016 10:50 AM  [place] MAIN [facility] SERVICES [address] [place], [place], [address] Phone: [phone]   Fax:  [phone]  Name: Yureli Arnow MRN: [id number] Date of Birth: 04/03/1966

## 2016-04-16 NOTE — Patient Instructions (Signed)
LE instructions and precautions as established- see initial eval.   

## 2016-04-30 ENCOUNTER — Encounter: Payer: BC Managed Care – PPO | Admitting: Occupational Therapy

## 2016-05-20 ENCOUNTER — Other Ambulatory Visit: Payer: Self-pay | Admitting: Family Medicine

## 2016-05-20 DIAGNOSIS — Z1231 Encounter for screening mammogram for malignant neoplasm of breast: Secondary | ICD-10-CM

## 2016-06-19 ENCOUNTER — Ambulatory Visit
Admission: RE | Admit: 2016-06-19 | Discharge: 2016-06-19 | Disposition: A | Discharge status: Home / Self Care | Payer: BC Managed Care – PPO | Source: Ambulatory Visit | Attending: Family Medicine | Admitting: Family Medicine

## 2016-06-19 ENCOUNTER — Other Ambulatory Visit: Payer: Self-pay | Admitting: Family Medicine

## 2016-06-19 DIAGNOSIS — Z1231 Encounter for screening mammogram for malignant neoplasm of breast: Secondary | ICD-10-CM | POA: Insufficient documentation

## 2016-07-01 DIAGNOSIS — Z6841 Body Mass Index (BMI) 40.0 and over, adult: Secondary | ICD-10-CM | POA: Insufficient documentation

## 2016-09-28 DIAGNOSIS — I639 Cerebral infarction, unspecified: Secondary | ICD-10-CM

## 2016-09-28 HISTORY — DX: Cerebral infarction, unspecified: I63.9

## 2016-11-06 DIAGNOSIS — K449 Diaphragmatic hernia without obstruction or gangrene: Secondary | ICD-10-CM | POA: Insufficient documentation

## 2017-01-12 DIAGNOSIS — M79672 Pain in left foot: Secondary | ICD-10-CM | POA: Insufficient documentation

## 2017-01-12 DIAGNOSIS — R208 Other disturbances of skin sensation: Secondary | ICD-10-CM | POA: Insufficient documentation

## 2017-05-10 ENCOUNTER — Other Ambulatory Visit: Payer: Self-pay | Admitting: Family Medicine

## 2017-05-10 DIAGNOSIS — Z1231 Encounter for screening mammogram for malignant neoplasm of breast: Secondary | ICD-10-CM

## 2017-06-21 ENCOUNTER — Ambulatory Visit
Admission: RE | Admit: 2017-06-21 | Discharge: 2017-06-21 | Disposition: A | Discharge status: Home / Self Care | Payer: BC Managed Care – PPO | Source: Ambulatory Visit | Attending: Medical Oncology | Admitting: Medical Oncology

## 2017-06-21 ENCOUNTER — Other Ambulatory Visit: Payer: Self-pay | Admitting: Medical Oncology

## 2017-06-21 DIAGNOSIS — R1084 Generalized abdominal pain: Secondary | ICD-10-CM | POA: Diagnosis present

## 2017-07-07 ENCOUNTER — Ambulatory Visit
Admission: RE | Admit: 2017-07-07 | Discharge: 2017-07-07 | Disposition: A | Discharge status: Home / Self Care | Payer: BC Managed Care – PPO | Source: Ambulatory Visit | Attending: Family Medicine | Admitting: Family Medicine

## 2017-07-07 DIAGNOSIS — Z1231 Encounter for screening mammogram for malignant neoplasm of breast: Secondary | ICD-10-CM | POA: Insufficient documentation

## 2017-08-24 DIAGNOSIS — R1900 Intra-abdominal and pelvic swelling, mass and lump, unspecified site: Secondary | ICD-10-CM | POA: Insufficient documentation

## 2017-08-28 DIAGNOSIS — L0390 Cellulitis, unspecified: Secondary | ICD-10-CM | POA: Insufficient documentation

## 2017-09-02 DIAGNOSIS — H53131 Sudden visual loss, right eye: Secondary | ICD-10-CM | POA: Insufficient documentation

## 2017-09-04 DIAGNOSIS — I639 Cerebral infarction, unspecified: Secondary | ICD-10-CM | POA: Insufficient documentation

## 2017-09-17 DIAGNOSIS — I6622 Occlusion and stenosis of left posterior cerebral artery: Secondary | ICD-10-CM | POA: Insufficient documentation

## 2017-09-24 DIAGNOSIS — D270 Benign neoplasm of right ovary: Secondary | ICD-10-CM | POA: Insufficient documentation

## 2017-09-24 DIAGNOSIS — Z9889 Other specified postprocedural states: Secondary | ICD-10-CM | POA: Insufficient documentation

## 2018-06-21 ENCOUNTER — Other Ambulatory Visit: Payer: Self-pay | Admitting: Family Medicine

## 2018-06-21 DIAGNOSIS — Z1231 Encounter for screening mammogram for malignant neoplasm of breast: Secondary | ICD-10-CM

## 2018-07-26 ENCOUNTER — Ambulatory Visit
Admission: RE | Admit: 2018-07-26 | Discharge: 2018-07-26 | Disposition: A | Discharge status: Home / Self Care | Payer: BC Managed Care – PPO | Source: Ambulatory Visit | Attending: Family Medicine | Admitting: Family Medicine

## 2018-07-26 ENCOUNTER — Encounter (INDEPENDENT_AMBULATORY_CARE_PROVIDER_SITE_OTHER): Payer: Self-pay

## 2018-07-26 DIAGNOSIS — Z1231 Encounter for screening mammogram for malignant neoplasm of breast: Secondary | ICD-10-CM | POA: Diagnosis present

## 2019-06-15 ENCOUNTER — Other Ambulatory Visit: Payer: Self-pay | Admitting: Family Medicine

## 2019-06-15 DIAGNOSIS — Z1231 Encounter for screening mammogram for malignant neoplasm of breast: Secondary | ICD-10-CM

## 2019-07-31 ENCOUNTER — Other Ambulatory Visit: Payer: Self-pay

## 2019-07-31 ENCOUNTER — Ambulatory Visit
Admission: RE | Admit: 2019-07-31 | Discharge: 2019-07-31 | Disposition: A | Discharge status: Home / Self Care | Payer: BC Managed Care – PPO | Source: Ambulatory Visit | Attending: Family Medicine | Admitting: Family Medicine

## 2019-07-31 DIAGNOSIS — Z1231 Encounter for screening mammogram for malignant neoplasm of breast: Secondary | ICD-10-CM | POA: Diagnosis not present

## 2019-10-16 ENCOUNTER — Other Ambulatory Visit: Payer: Self-pay

## 2019-10-16 ENCOUNTER — Ambulatory Visit (INDEPENDENT_AMBULATORY_CARE_PROVIDER_SITE_OTHER): Payer: BC Managed Care – PPO | Admitting: Vascular Surgery

## 2019-10-16 ENCOUNTER — Encounter (INDEPENDENT_AMBULATORY_CARE_PROVIDER_SITE_OTHER): Payer: Self-pay | Admitting: Vascular Surgery

## 2019-10-16 DIAGNOSIS — I872 Venous insufficiency (chronic) (peripheral): Secondary | ICD-10-CM | POA: Insufficient documentation

## 2019-10-16 DIAGNOSIS — I890 Lymphedema, not elsewhere classified: Secondary | ICD-10-CM | POA: Diagnosis not present

## 2019-10-16 DIAGNOSIS — M5410 Radiculopathy, site unspecified: Secondary | ICD-10-CM | POA: Diagnosis not present

## 2019-10-16 DIAGNOSIS — M8949 Other hypertrophic osteoarthropathy, multiple sites: Secondary | ICD-10-CM | POA: Diagnosis not present

## 2019-10-16 DIAGNOSIS — M1990 Unspecified osteoarthritis, unspecified site: Secondary | ICD-10-CM | POA: Insufficient documentation

## 2019-10-16 DIAGNOSIS — M159 Polyosteoarthritis, unspecified: Secondary | ICD-10-CM

## 2019-10-16 NOTE — Progress Notes (Signed)
MRN : [id number]  Shari Parks is a 54 y.o. ([birth year]) female who presents with chief complaint of No chief complaint on file. Marland Kitchen  History of Present Illness:   The patient is seen for evaluation of painful lower extremities. Patient notes the pain is variable and not always associated with activity.  She notes that standing will pretty consistently elicit the pain.  She also describes pain occurring at night in her sleep and waking her up.  Although she does have the pain when she is standing up walking walking itself does not make the pain worse.  She finds that leaning forward on a shopping cart actually lessens the degree of discomfort.  The pain is somewhat consistent day to day occurring on most days. The patient notes the pain also occurs with standing and routinely seems worse as the day wears on.  She states the pain begins down by her knee and then radiates upward laterally along her thigh and recently has been radiating all the way to her lower back.  This has been pretty consistent.  The pain has been progressive over the past several years. The patient states these symptoms are causing  a profound negative impact on quality of life and daily activities.  She notes that she has been struggling with increasing lymphedema for at least 5 years she is always had some swelling but approximately 5 years ago she feels there was a significant and fairly abrupt increase.  In 2018 she did have pelvic surgery for a pelvic mass and underwent hysterectomy and bilateral oophorectomy.  She was told this was benign.  Does not appear that a lymph node dissection was undertaken at that time.  No history of radiation treatments.  She notes that she was sent to physical therapy and feels that this has not been helpful perhaps even she is a little worse now than she was before she started therapy.  She also states that she working through the physical therapist now has custom compression garments that  extend from the foot to the top of her legs with a combination of thigh-high's as well as compression shorts.  She finds that initially they feel okay but as time goes on her lateral right thigh becomes increasingly irritated by the compression.  The patient denies rest pain or dangling of an extremity off the side of the bed during the night for relief. No open wounds or sores at this time. No history of DVT or phlebitis. No prior vascular interventions or surgeries.  She does not have a known history of back problems and DJD of the lumbar and sacral spine.     No outpatient medications have been marked as taking for the 10/16/19 encounter (Appointment) with Delana Meyer, Dolores Lory, MD.    Past Medical History:  Diagnosis Date  . Arthritis    left knee  . Heart murmur    "slight" - followed by PCP  . Wears contact lenses     Past Surgical History:  Procedure Laterality Date  . CHOLECYSTECTOMY  6/92  . COLONOSCOPY  04/30/14  . ESOPHAGOGASTRIC FUNDOPLASTY  2012  . HAMMER TOE SURGERY Right 04/05/13  . KNEE ARTHROSCOPY Left 11/22/2015   Procedure: ARTHROSCOPY KNEE WITH CHONDROPLASTY MEDIAL FEMORAL CONDYLE;  Surgeon: Leanor Kail, MD;  Location: [facility];  Service: Orthopedics;  Laterality: Left;  order of cases moved per CeCe and Dr. Leslye Peer  . NOVASURE ABLATION  approx 2011  . TONSILLECTOMY  6/84  Social History Social History   Tobacco Use  . Smoking status: Never Smoker  Substance Use Topics  . Alcohol use: Yes    Alcohol/week: 1.0 standard drinks    Types: 1 Glasses of wine per week  . Drug use: Not on file    Family History Family History  Problem Relation Age of Onset  . Breast cancer Sister 48  . Breast cancer Niece 85  No family history of bleeding/clotting disorders, porphyria or autoimmune disease   Allergies  Allergen Reactions  . Sulfa Antibiotics Swelling    Tongue, high fever  . Tetanus Toxoids Other (See Comments)    High fever      REVIEW OF SYSTEMS (Negative unless checked)  Constitutional: [] Weight loss  [] Fever  [] Chills Cardiac: [] Chest pain   [] Chest pressure   [] Palpitations   [] Shortness of breath when laying flat   [] Shortness of breath with exertion. Vascular:  [x] Pain in legs with walking   [x] Pain in legs at rest  [] History of DVT   [] Phlebitis   [x] Swelling in legs   [] Varicose veins   [] Non-healing ulcers Pulmonary:   [] Uses home oxygen   [] Productive cough   [] Hemoptysis   [] Wheeze  [] COPD   [] Asthma Neurologic:  [] Dizziness   [] Seizures   [] History of stroke   [] History of TIA  [] Aphasia   [] Vissual changes   [] Weakness or numbness in arm   [] Weakness or numbness in leg Musculoskeletal:   [] Joint swelling   [] Joint pain   [x] Low back pain Hematologic:  [] Easy bruising  [] Easy bleeding   [] Hypercoagulable state   [] Anemic Gastrointestinal:  [] Diarrhea   [] Vomiting  [] Gastroesophageal reflux/heartburn   [] Difficulty swallowing. Genitourinary:  [] Chronic kidney disease   [] Difficult urination  [] Frequent urination   [] Blood in urine Skin:  [] Rashes   [] Ulcers  Psychological:  [] History of anxiety   []  History of major depression.  Physical Examination  There were no vitals filed for this visit. There is no height or weight on file to calculate BMI. Gen: WD/WN, NAD Head: [place]/AT, No temporalis wasting.  Ear/Nose/Throat: Hearing grossly intact, nares w/o erythema or drainage, poor dentition Eyes: PER, EOMI, sclera nonicteric.  Neck: Supple, no masses.  No bruit or JVD.  Pulmonary:  Good air movement, clear to auscultation bilaterally, no use of accessory muscles.  Cardiac: RRR, normal S1, S2, no Murmurs. Vascular: scattered varicosities present bilaterally.  Mild venous stasis changes to the legs bilaterally.  3-4+ soft pitting edema Gastrointestinal: soft, non-distended. No guarding/no peritoneal signs.  Musculoskeletal: M/S 5/5 throughout.  No deformity or atrophy.  Neurologic: CN 2-12 intact. Pain  and light touch intact in extremities.  Symmetrical.  Speech is fluent. Motor exam as listed above. Psychiatric: Judgment intact, Mood & affect appropriate for pt's clinical situation. Dermatologic: No rashes or ulcers noted.  No changes consistent with cellulitis. Lymph : No Cervical lymphadenopathy, no lichenification or skin changes of chronic lymphedema.  CBC No results found for: WBC, HGB, HCT, MCV, PLT  BMET No results found for: NA, K, CL, CO2, GLUCOSE, BUN, CREATININE, CALCIUM, GFRNONAA, GFRAA CrCl cannot be calculated (No successful lab value found.).  COAG No results found for: INR, PROTIME  Radiology No results found.   Assessment/Plan 1. Lymphedema Recommend:  No surgery or intervention at this point in time.    I have reviewed my previous discussion with the patient regarding swelling and why it causes symptoms.  Patient will continue wearing graduated compression stockings class 1 (20-30 mmHg) on a daily  basis. The patient will  beginning wearing the stockings first thing in the morning and removing them in the evening. The patient is instructed specifically not to sleep in the stockings.    In addition, behavioral modification including several periods of elevation of the lower extremities during the day will be continued.  This was reviewed with the patient during the initial visit.  The patient will also continue routine exercise, especially walking on a daily basis as was discussed during the initial visit.    Despite conservative treatments including graduated compression therapy class 1 and behavioral modification including exercise and elevation the patient  has not obtained adequate control of the lymphedema.  The patient still has stage 3 lymphedema and therefore, I believe that a lymph pump should be added to improve the control of the patient's lymphedema.  Additionally, a lymph pump is warranted because it will reduce the risk of cellulitis and ulceration in  the future.  Given her situation with her lymphedema extending well up to the proximal thigh I believe bilateral leggings with an abdominal component is indicated.  Patient should follow-up in six months    2. Chronic venous insufficiency Recommend:  No surgery or intervention at this point in time.    I have reviewed my previous discussion with the patient regarding swelling and why it causes symptoms.  Patient will continue wearing graduated compression stockings class 1 (20-30 mmHg) on a daily basis. The patient will  beginning wearing the stockings first thing in the morning and removing them in the evening. The patient is instructed specifically not to sleep in the stockings.    In addition, behavioral modification including several periods of elevation of the lower extremities during the day will be continued.  This was reviewed with the patient during the initial visit.  The patient will also continue routine exercise, especially walking on a daily basis as was discussed during the initial visit.    Despite conservative treatments including graduated compression therapy class 1 and behavioral modification including exercise and elevation the patient  has not obtained adequate control of the lymphedema.  The patient still has stage 3 lymphedema and therefore, I believe that a lymph pump should be added to improve the control of the patient's lymphedema.  Additionally, a lymph pump is warranted because it will reduce the risk of cellulitis and ulceration in the future.  Patient should follow-up in six months    3. Radicular leg pain Recommend:  The patient has atypical pain symptoms for vascular disease  I do not find evidence of Vascular pathology that would explain the patient's symptoms and I suspect the patient is c/o pseudoclaudication.  Patient should have an evaluation of his LS spine which I defer to the primary service.  The patient should continue PT and walking and  continue a more formal exercise program. The patient should continue his antiplatelet therapy and aggressive treatment of the lipid abnormalities. The patient should continue wearing graduated compression to control her lymphedema.  Further work-up of her lower extremity pain is deferred to the primary service     4. Primary osteoarthritis involving multiple joints Continue NSAID medications as already ordered, these medications have been reviewed and there are no changes at this time.  Continued activity and therapy was stressed.    Hortencia Pilar, MD  10/16/2019 2:41 PM

## 2019-10-31 ENCOUNTER — Other Ambulatory Visit: Payer: Self-pay | Admitting: Family Medicine

## 2019-10-31 DIAGNOSIS — M5416 Radiculopathy, lumbar region: Secondary | ICD-10-CM

## 2019-11-06 ENCOUNTER — Other Ambulatory Visit: Payer: Self-pay

## 2019-11-06 ENCOUNTER — Ambulatory Visit
Admission: RE | Admit: 2019-11-06 | Discharge: 2019-11-06 | Disposition: A | Discharge status: Home / Self Care | Payer: BC Managed Care – PPO | Source: Ambulatory Visit | Attending: Family Medicine | Admitting: Family Medicine

## 2019-11-06 DIAGNOSIS — M5416 Radiculopathy, lumbar region: Secondary | ICD-10-CM | POA: Insufficient documentation

## 2019-11-10 ENCOUNTER — Ambulatory Visit: Payer: BC Managed Care – PPO

## 2020-01-18 DIAGNOSIS — M48061 Spinal stenosis, lumbar region without neurogenic claudication: Secondary | ICD-10-CM | POA: Insufficient documentation

## 2020-01-18 DIAGNOSIS — G8929 Other chronic pain: Secondary | ICD-10-CM | POA: Insufficient documentation

## 2020-01-18 DIAGNOSIS — M5116 Intervertebral disc disorders with radiculopathy, lumbar region: Secondary | ICD-10-CM | POA: Insufficient documentation

## 2020-04-15 ENCOUNTER — Ambulatory Visit (INDEPENDENT_AMBULATORY_CARE_PROVIDER_SITE_OTHER): Payer: BC Managed Care – PPO | Admitting: Vascular Surgery

## 2020-04-22 ENCOUNTER — Other Ambulatory Visit: Payer: Self-pay

## 2020-04-22 ENCOUNTER — Ambulatory Visit (INDEPENDENT_AMBULATORY_CARE_PROVIDER_SITE_OTHER): Payer: BC Managed Care – PPO | Admitting: Vascular Surgery

## 2020-04-22 ENCOUNTER — Encounter (INDEPENDENT_AMBULATORY_CARE_PROVIDER_SITE_OTHER): Payer: Self-pay | Admitting: Vascular Surgery

## 2020-04-22 VITALS — BP 138/83 | HR 67 | Resp 16 | Wt 267.8 lb

## 2020-04-22 DIAGNOSIS — I890 Lymphedema, not elsewhere classified: Secondary | ICD-10-CM

## 2020-04-22 DIAGNOSIS — I872 Venous insufficiency (chronic) (peripheral): Secondary | ICD-10-CM

## 2020-04-22 DIAGNOSIS — M8949 Other hypertrophic osteoarthropathy, multiple sites: Secondary | ICD-10-CM | POA: Diagnosis not present

## 2020-04-22 DIAGNOSIS — I8390 Asymptomatic varicose veins of unspecified lower extremity: Secondary | ICD-10-CM | POA: Insufficient documentation

## 2020-04-22 DIAGNOSIS — M159 Polyosteoarthritis, unspecified: Secondary | ICD-10-CM

## 2020-04-23 ENCOUNTER — Encounter (INDEPENDENT_AMBULATORY_CARE_PROVIDER_SITE_OTHER): Payer: Self-pay | Admitting: Vascular Surgery

## 2020-04-23 NOTE — Progress Notes (Signed)
MRN : [id number]  Shari Parks is a 54 y.o. (April 25, 1966) female who presents with chief complaint of  Chief Complaint  Patient presents with  . Follow-up    74month follow up  .  History of Present Illness:   The patient returns to the office for followup evaluation regarding leg swelling.  The swelling has improved quite a bit and the pain associated with swelling has decreased substantially. There have not been any interval development of a ulcerations or wounds.  Since the previous visit the patient has been wearing graduated compression stockings and has noted little significant improvement in the lymphedema. The patient has been using compression routinely morning until night.  The patient also states elevation during the day and exercise is being done too.  She now has a new lymphedema pump which she has been using on a routine basis.  She states it has been a tremendous benefit.     Current Meds  Medication Sig  . aspirin 81 MG tablet Take 81 mg by mouth daily.  . Cholecalciferol 1000 units capsule Take 1,000 Units by mouth daily.  . furosemide (LASIX) 20 MG tablet Take 20 mg by mouth as needed.   . gabapentin (NEURONTIN) 300 MG capsule Take 300 mg by mouth at bedtime.  Marland Kitchen ibuprofen (ADVIL,MOTRIN) 200 MG tablet Take 200 mg by mouth every 6 (six) hours as needed.  . Multiple Vitamin (MULTIVITAMIN) capsule Take 1 capsule by mouth daily.  . simvastatin (ZOCOR) 20 MG tablet Take 20 mg by mouth at bedtime.    Past Medical History:  Diagnosis Date  . Arthritis    left knee  . Heart murmur    "slight" - followed by PCP  . Wears contact lenses     Past Surgical History:  Procedure Laterality Date  . CHOLECYSTECTOMY  6/92  . COLONOSCOPY  04/30/14  . ESOPHAGOGASTRIC FUNDOPLASTY  2012  . HAMMER TOE SURGERY Right 04/05/13  . KNEE ARTHROSCOPY Left 11/22/2015   Procedure: ARTHROSCOPY KNEE WITH CHONDROPLASTY MEDIAL FEMORAL CONDYLE;  Surgeon: Leanor Kail, MD;  Location:  [place];  Service: Orthopedics;  Laterality: Left;  order of cases moved per CeCe and Dr. Leslye Peer  . NOVASURE ABLATION  approx 2011  . TONSILLECTOMY  6/84    Social History Social History   Tobacco Use  . Smoking status: Never Smoker  . Smokeless tobacco: Never Used  Substance Use Topics  . Alcohol use: Yes    Alcohol/week: 1.0 standard drink    Types: 1 Glasses of wine per week  . Drug use: Never    Family History Family History  Problem Relation Age of Onset  . Breast cancer Sister 72  . Breast cancer Niece 88    Allergies  Allergen Reactions  . Other Anaphylaxis  . Sulfa Antibiotics Swelling, Anaphylaxis and Rash    Tongue, high fever Other reaction(s): Other (See Comments), Other (See Comments) Fever and tongue swells Fever and tongue swells  Other reaction(s): Other (See Comments) Fever and tongue swells Tongue, high fever Other reaction(s): Other (See Comments), Other (See Comments) Fever and tongue swells Fever and tongue swells  . Sulfasalazine Swelling    Tongue, high fever  . Tetanus Toxoids Other (See Comments)    High fever     REVIEW OF SYSTEMS (Negative unless checked)  Constitutional: [] Weight loss  [] Fever  [] Chills Cardiac: [] Chest pain   [] Chest pressure   [] Palpitations   [] Shortness of breath when laying flat   [] Shortness of  breath with exertion. Vascular:  [] Pain in legs with walking   [] Pain in legs at rest  [] History of DVT   [] Phlebitis   [x] Swelling in legs   [] Varicose veins   [] Non-healing ulcers Pulmonary:   [] Uses home oxygen   [] Productive cough   [] Hemoptysis   [] Wheeze  [] COPD   [] Asthma Neurologic:  [] Dizziness   [] Seizures   [] History of stroke   [] History of TIA  [] Aphasia   [] Vissual changes   [] Weakness or numbness in arm   [] Weakness or numbness in leg Musculoskeletal:   [] Joint swelling   [x] Joint pain   [x] Low back pain Hematologic:  [] Easy bruising  [] Easy bleeding   [] Hypercoagulable state    [] Anemic Gastrointestinal:  [] Diarrhea   [] Vomiting  [] Gastroesophageal reflux/heartburn   [] Difficulty swallowing. Genitourinary:  [] Chronic kidney disease   [] Difficult urination  [] Frequent urination   [] Blood in urine Skin:  [] Rashes   [] Ulcers  Psychological:  [] History of anxiety   []  History of major depression.  Physical Examination  Vitals:   04/22/20 1539  BP: (!) 138/83  Pulse: 67  Resp: 16  Weight: (!) 267 lb 12.8 oz (121.5 kg)   Body mass index is 42.58 kg/m. Gen: WD/WN, NAD Head: [place]/AT, No temporalis wasting.  Ear/Nose/Throat: Hearing grossly intact, nares w/o erythema or drainage Eyes: PER, EOMI, sclera nonicteric.  Neck: Supple, no large masses.   Pulmonary:  Good air movement, no audible wheezing bilaterally, no use of accessory muscles.  Cardiac: RRR, no JVD Vascular: scattered varicosities present bilaterally.  Mild venous stasis changes to the legs bilaterally.  2+ soft pitting edema Vessel Right Left  Radial Palpable Palpable  Gastrointestinal: Non-distended. No guarding/no peritoneal signs.  Musculoskeletal: M/S 5/5 throughout.  No deformity or atrophy.  Neurologic: CN 2-12 intact. Symmetrical.  Speech is fluent. Motor exam as listed above. Psychiatric: Judgment intact, Mood & affect appropriate for pt's clinical situation. Dermatologic: Mild venous rashes no ulcers noted.  No changes consistent with cellulitis. Lymph : Mild lichenification with skin changes of chronic lymphedema.  CBC No results found for: WBC, HGB, HCT, MCV, PLT  BMET No results found for: NA, K, CL, CO2, GLUCOSE, BUN, CREATININE, CALCIUM, GFRNONAA, GFRAA CrCl cannot be calculated (No successful lab value found.).  COAG No results found for: INR, PROTIME  Radiology No results found.    Assessment/Plan 1. Lymphedema  No surgery or intervention at this point in time.    I have reviewed my discussion with the patient regarding lymphedema and why it  causes symptoms.  Patient  will continue wearing graduated compression stockings class 1 (20-30 mmHg) on a daily basis a prescription was given. The patient is reminded to put the stockings on first thing in the morning and removing them in the evening. The patient is instructed specifically not to sleep in the stockings.   In addition, behavioral modification throughout the day will be continued.  This will include frequent elevation (such as in a recliner), use of over the counter pain medications as needed and exercise such as walking.  I have reviewed systemic causes for chronic edema such as liver, kidney and cardiac etiologies and there does not appear to be any significant changes in these organ systems over the past year.  The patient is under the impression that these organ systems are all stable and unchanged.    The patient will continue aggressive use of the  lymph pump.  This will continue to improve the edema control and prevent sequela such  as ulcers and infections.   The patient will follow-up with me on an annual basis.    2. Chronic venous insufficiency  No surgery or intervention at this point in time.    I have reviewed my discussion with the patient regarding lymphedema and why it  causes symptoms.  Patient will continue wearing graduated compression stockings class 1 (20-30 mmHg) on a daily basis a prescription was given. The patient is reminded to put the stockings on first thing in the morning and removing them in the evening. The patient is instructed specifically not to sleep in the stockings.   In addition, behavioral modification throughout the day will be continued.  This will include frequent elevation (such as in a recliner), use of over the counter pain medications as needed and exercise such as walking.  I have reviewed systemic causes for chronic edema such as liver, kidney and cardiac etiologies and there does not appear to be any significant changes in these organ systems over the past  year.  The patient is under the impression that these organ systems are all stable and unchanged.    The patient will continue aggressive use of the  lymph pump.  This will continue to improve the edema control and prevent sequela such as ulcers and infections.   The patient will follow-up with me on an annual basis.    3. Primary osteoarthritis involving multiple joints Continue NSAID medications as already ordered, these medications have been reviewed and there are no changes at this time.  Continued activity and therapy was stressed.     Hortencia Pilar, MD  04/23/2020 3:50 PM

## 2020-06-07 ENCOUNTER — Other Ambulatory Visit: Payer: Self-pay | Admitting: Gerontology

## 2020-06-07 DIAGNOSIS — R112 Nausea with vomiting, unspecified: Secondary | ICD-10-CM

## 2020-06-21 ENCOUNTER — Ambulatory Visit: Admission: RE | Admit: 2020-06-21 | Payer: BC Managed Care – PPO | Source: Ambulatory Visit

## 2020-06-26 ENCOUNTER — Other Ambulatory Visit: Payer: Self-pay

## 2020-06-26 ENCOUNTER — Ambulatory Visit
Admission: RE | Admit: 2020-06-26 | Discharge: 2020-06-26 | Disposition: A | Discharge status: Home / Self Care | Payer: BC Managed Care – PPO | Source: Ambulatory Visit | Attending: Gerontology | Admitting: Gerontology

## 2020-06-26 DIAGNOSIS — R112 Nausea with vomiting, unspecified: Secondary | ICD-10-CM | POA: Insufficient documentation

## 2020-06-26 MED ORDER — IOHEXOL 300 MG/ML  SOLN
100.0000 mL | Freq: Once | INTRAMUSCULAR | Status: AC | PRN
  Administered 2020-06-26: 100 mL via INTRAVENOUS

## 2020-07-01 DIAGNOSIS — E7800 Pure hypercholesterolemia, unspecified: Secondary | ICD-10-CM | POA: Insufficient documentation

## 2020-07-08 ENCOUNTER — Other Ambulatory Visit: Payer: Self-pay | Admitting: Gerontology

## 2020-07-08 DIAGNOSIS — Z1231 Encounter for screening mammogram for malignant neoplasm of breast: Secondary | ICD-10-CM

## 2020-08-02 ENCOUNTER — Other Ambulatory Visit: Payer: Self-pay

## 2020-08-02 ENCOUNTER — Other Ambulatory Visit
Admission: RE | Admit: 2020-08-02 | Discharge: 2020-08-02 | Disposition: A | Discharge status: Home / Self Care | Payer: BC Managed Care – PPO | Source: Ambulatory Visit | Attending: Gastroenterology | Admitting: Gastroenterology

## 2020-08-02 DIAGNOSIS — Z20822 Contact with and (suspected) exposure to covid-19: Secondary | ICD-10-CM | POA: Insufficient documentation

## 2020-08-02 DIAGNOSIS — Z01812 Encounter for preprocedural laboratory examination: Secondary | ICD-10-CM | POA: Insufficient documentation

## 2020-08-02 LAB — SARS CORONAVIRUS 2 (TAT 6-24 HRS): SARS Coronavirus 2: NEGATIVE

## 2020-08-05 ENCOUNTER — Encounter: Payer: Self-pay | Admitting: *Deleted

## 2020-08-06 ENCOUNTER — Other Ambulatory Visit: Payer: Self-pay

## 2020-08-06 ENCOUNTER — Ambulatory Visit
Admission: RE | Admit: 2020-08-06 | Discharge: 2020-08-06 | Disposition: A | Discharge status: Home / Self Care | Payer: BC Managed Care – PPO | Attending: Gastroenterology | Admitting: Gastroenterology

## 2020-08-06 ENCOUNTER — Ambulatory Visit: Payer: BC Managed Care – PPO | Admitting: Certified Registered Nurse Anesthetist

## 2020-08-06 ENCOUNTER — Encounter: Payer: Self-pay | Admitting: *Deleted

## 2020-08-06 ENCOUNTER — Encounter
Admission: RE | Disposition: A | Discharge status: Home / Self Care | Payer: Self-pay | Source: Home / Self Care | Attending: Gastroenterology

## 2020-08-06 DIAGNOSIS — Z887 Allergy status to serum and vaccine status: Secondary | ICD-10-CM | POA: Diagnosis not present

## 2020-08-06 DIAGNOSIS — Z882 Allergy status to sulfonamides status: Secondary | ICD-10-CM | POA: Diagnosis not present

## 2020-08-06 DIAGNOSIS — Z9884 Bariatric surgery status: Secondary | ICD-10-CM | POA: Insufficient documentation

## 2020-08-06 DIAGNOSIS — Z791 Long term (current) use of non-steroidal anti-inflammatories (NSAID): Secondary | ICD-10-CM | POA: Insufficient documentation

## 2020-08-06 DIAGNOSIS — Z79899 Other long term (current) drug therapy: Secondary | ICD-10-CM | POA: Diagnosis not present

## 2020-08-06 DIAGNOSIS — Z7982 Long term (current) use of aspirin: Secondary | ICD-10-CM | POA: Insufficient documentation

## 2020-08-06 DIAGNOSIS — E7800 Pure hypercholesterolemia, unspecified: Secondary | ICD-10-CM | POA: Diagnosis not present

## 2020-08-06 DIAGNOSIS — Z7952 Long term (current) use of systemic steroids: Secondary | ICD-10-CM | POA: Diagnosis not present

## 2020-08-06 DIAGNOSIS — K2960 Other gastritis without bleeding: Secondary | ICD-10-CM | POA: Diagnosis not present

## 2020-08-06 DIAGNOSIS — Z8673 Personal history of transient ischemic attack (TIA), and cerebral infarction without residual deficits: Secondary | ICD-10-CM | POA: Diagnosis not present

## 2020-08-06 DIAGNOSIS — K3189 Other diseases of stomach and duodenum: Secondary | ICD-10-CM | POA: Diagnosis not present

## 2020-08-06 DIAGNOSIS — R1013 Epigastric pain: Secondary | ICD-10-CM | POA: Insufficient documentation

## 2020-08-06 DIAGNOSIS — K449 Diaphragmatic hernia without obstruction or gangrene: Secondary | ICD-10-CM | POA: Insufficient documentation

## 2020-08-06 HISTORY — DX: Pure hypercholesterolemia, unspecified: E78.00

## 2020-08-06 HISTORY — DX: Personal history of other diseases of the digestive system: Z87.19

## 2020-08-06 HISTORY — PX: ESOPHAGOGASTRODUODENOSCOPY (EGD) WITH PROPOFOL: SHX5813

## 2020-08-06 HISTORY — DX: Cerebral infarction, unspecified: I63.9

## 2020-08-06 HISTORY — DX: Asymptomatic varicose veins of unspecified lower extremity: I83.90

## 2020-08-06 HISTORY — DX: Lymphedema, not elsewhere classified: I89.0

## 2020-08-06 SURGERY — ESOPHAGOGASTRODUODENOSCOPY (EGD) WITH PROPOFOL
Anesthesia: General

## 2020-08-06 MED ORDER — EPHEDRINE SULFATE 50 MG/ML IJ SOLN
INTRAMUSCULAR | Status: DC | PRN
  Administered 2020-08-06: 5 mg via INTRAVENOUS

## 2020-08-06 MED ORDER — GLYCOPYRROLATE 0.2 MG/ML IJ SOLN
INTRAMUSCULAR | Status: DC | PRN
  Administered 2020-08-06: .2 mg via INTRAVENOUS

## 2020-08-06 MED ORDER — FENTANYL CITRATE (PF) 100 MCG/2ML IJ SOLN
INTRAMUSCULAR | Status: DC | PRN
Start: 2020-08-06 — End: 2020-08-06
  Administered 2020-08-06 (×2): 25 ug via INTRAVENOUS

## 2020-08-06 MED ORDER — PROPOFOL 500 MG/50ML IV EMUL
INTRAVENOUS | Status: AC
  Filled 2020-08-06: qty 50

## 2020-08-06 MED ORDER — PROPOFOL 10 MG/ML IV BOLUS
INTRAVENOUS | Status: AC
  Filled 2020-08-06: qty 20

## 2020-08-06 MED ORDER — LIDOCAINE HCL (CARDIAC) PF 100 MG/5ML IV SOSY
PREFILLED_SYRINGE | INTRAVENOUS | Status: DC | PRN
  Administered 2020-08-06: 100 mg via INTRAVENOUS

## 2020-08-06 MED ORDER — LIDOCAINE HCL (PF) 2 % IJ SOLN
INTRAMUSCULAR | Status: AC
  Filled 2020-08-06: qty 10

## 2020-08-06 MED ORDER — FENTANYL CITRATE (PF) 100 MCG/2ML IJ SOLN
INTRAMUSCULAR | Status: AC
  Filled 2020-08-06: qty 2

## 2020-08-06 MED ORDER — PROPOFOL 10 MG/ML IV BOLUS
INTRAVENOUS | Status: DC | PRN
  Administered 2020-08-06: 30 mg via INTRAVENOUS
  Administered 2020-08-06: 40 mg via INTRAVENOUS
  Administered 2020-08-06: 20 mg via INTRAVENOUS

## 2020-08-06 MED ORDER — PROPOFOL 500 MG/50ML IV EMUL
INTRAVENOUS | Status: DC | PRN
  Administered 2020-08-06: 125 ug/kg/min via INTRAVENOUS

## 2020-08-06 MED ORDER — SODIUM CHLORIDE 0.9 % IV SOLN: INTRAVENOUS | Status: DC

## 2020-08-06 NOTE — Op Note (Signed)
[facility] Gastroenterology Patient Name: Shari Parks Procedure Date: 08/06/2020 8:59 AM MRN: [id number] Account #: [id number] Date of Birth: 1966/07/28 Admit Type: Outpatient Age: 54 Room: [facility] ENDO ROOM 1 Gender: Female Note Status: Finalized Procedure:             Upper GI endoscopy Indications:           Dyspepsia Providers:             Andrey Farmer MD, MD Referring MD:          Toni Arthurs (Referring MD) Medicines:             Monitored Anesthesia Care Complications:         No immediate complications. Estimated blood loss:                         Minimal. Procedure:             Pre-Anesthesia Assessment:                        - Prior to the procedure, a History and Physical was                         performed, and patient medications and allergies were                         reviewed. The patient is competent. The risks and                         benefits of the procedure and the sedation options and                         risks were discussed with the patient. All questions                         were answered and informed consent was obtained.                         Patient identification and proposed procedure were                         verified by the physician, the nurse, the anesthetist                         and the technician in the endoscopy suite. Mental                         Status Examination: alert and oriented. Airway                         Examination: normal oropharyngeal airway and neck                         mobility. Respiratory Examination: clear to                         auscultation. CV Examination: normal. Prophylactic                         Antibiotics: The patient  does not require prophylactic                         antibiotics. Prior Anticoagulants: The patient has                         taken no previous anticoagulant or antiplatelet                         agents. ASA Grade Assessment: II - A patient with  mild                         systemic disease. After reviewing the risks and                         benefits, the patient was deemed in satisfactory                         condition to undergo the procedure. The anesthesia                         plan was to use monitored anesthesia care (MAC).                         Immediately prior to administration of medications,                         the patient was re-assessed for adequacy to receive                         sedatives. The heart rate, respiratory rate, oxygen                         saturations, blood pressure, adequacy of pulmonary                         ventilation, and response to care were monitored                         throughout the procedure. The physical status of the                         patient was re-assessed after the procedure.                        After obtaining informed consent, the endoscope was                         passed under direct vision. Throughout the procedure,                         the patient's blood pressure, pulse, and oxygen                         saturations were monitored continuously. The Endoscope                         was introduced through the mouth, and advanced to the  second part of duodenum. The upper GI endoscopy was                         accomplished without difficulty. The patient tolerated                         the procedure well. Findings:      A 5 cm hiatal hernia was present.      The exam of the esophagus was otherwise normal.      Bilious fluid was found in the gastric antrum.      Diffuse moderately erythematous mucosa without bleeding was found in the       gastric antrum. Biopsies were taken with a cold forceps for histology.       Estimated blood loss was minimal.      The examined duodenum was normal. Impression:            - 5 cm hiatal hernia.                        - Bilious gastric fluid.                        - Erythematous  mucosa in the antrum. Biopsied.                        - Normal examined duodenum. Recommendation:        - Discharge patient to home.                        - Resume previous diet.                        - Continue present medications.                        - Await pathology results.                        - Return to referring physician as previously                         scheduled. Procedure Code(s):     --- Professional ---                        [phone], Esophagogastroduodenoscopy, flexible,                         transoral; with biopsy, single or multiple Diagnosis Code(s):     --- Professional ---                        K44.9, Diaphragmatic hernia without obstruction or                         gangrene                        K31.89, Other diseases of stomach and duodenum                        R10.13, Epigastric pain CPT copyright 2019 American Medical Association. All  rights reserved. The codes documented in this report are preliminary and upon coder review may  be revised to meet current compliance requirements. Andrey Farmer, MD Andrey Farmer MD, MD 08/06/2020 9:30:50 AM Number of Addenda: 0 Note Initiated On: 08/06/2020 8:59 AM Estimated Blood Loss:  Estimated blood loss was minimal.      [facility]

## 2020-08-06 NOTE — Anesthesia Preprocedure Evaluation (Signed)
Anesthesia Evaluation  Patient identified by MRN, date of birth, ID band Patient awake    Reviewed: Allergy & Precautions, NPO status , Patient's Chart, lab work & pertinent test results  Airway Mallampati: II       Dental no notable dental hx.    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   Neuromuscular disease CVA, Residual Symptoms negative psych ROS   GI/Hepatic Neg liver ROS, hiatal hernia,   Endo/Other  negative endocrine ROS  Renal/GU negative Renal ROS  negative genitourinary   Musculoskeletal  (+) Arthritis ,   Abdominal   Peds negative pediatric ROS (+)  Hematology negative hematology ROS (+)   Anesthesia Other Findings   Reproductive/Obstetrics negative OB ROS                             Anesthesia Physical Anesthesia Plan  ASA: III  Anesthesia Plan: General   Post-op Pain Management:    Induction: Intravenous  PONV Risk Score and Plan: 3 and Propofol infusion  Airway Management Planned: Nasal Cannula  Additional Equipment: None  Intra-op Plan:   Post-operative Plan:   Informed Consent: I have reviewed the patients History and Physical, chart, labs and discussed the procedure including the risks, benefits and alternatives for the proposed anesthesia with the patient or authorized representative who has indicated his/her understanding and acceptance.       Plan Discussed with: CRNA, Anesthesiologist and Surgeon  Anesthesia Plan Comments:         Anesthesia Quick Evaluation

## 2020-08-06 NOTE — Anesthesia Procedure Notes (Signed)
Procedure Name: MAC Date/Time: 08/06/2020 9:23 AM Performed by: Lily Peer, Matheau Orona, CRNA Pre-anesthesia Checklist: Patient identified, Emergency Drugs available, Suction available and Patient being monitored Patient Re-evaluated:Patient Re-evaluated prior to induction Oxygen Delivery Method: Simple face mask

## 2020-08-06 NOTE — Transfer of Care (Signed)
Immediate Anesthesia Transfer of Care Note  Patient: Shari Parks  Procedure(s) Performed: ESOPHAGOGASTRODUODENOSCOPY (EGD) WITH PROPOFOL (N/A )  Patient Location: PACU and Endoscopy Unit  Anesthesia Type:General  Level of Consciousness: awake, alert  and oriented  Airway & Oxygen Therapy: Patient Spontanous Breathing  Post-op Assessment: Report given to RN and Post -op Vital signs reviewed and stable  Post vital signs: Reviewed and stable  Last Vitals:  Vitals Value Taken Time  BP 122/88   Temp    Pulse 77   Resp 12   SpO2 99     Last Pain:  Vitals:   08/06/20 0856  TempSrc: Temporal  PainSc: 0-No pain         Complications: No complications documented.

## 2020-08-06 NOTE — Anesthesia Postprocedure Evaluation (Signed)
Anesthesia Post Note  Patient: Shari Parks  Procedure(s) Performed: ESOPHAGOGASTRODUODENOSCOPY (EGD) WITH PROPOFOL (N/A )  Patient location during evaluation: Endoscopy Anesthesia Type: General Level of consciousness: awake Pain management: pain level controlled Vital Signs Assessment: post-procedure vital signs reviewed and stable Respiratory status: spontaneous breathing and nonlabored ventilation Cardiovascular status: blood pressure returned to baseline and stable Postop Assessment: no headache and no apparent nausea or vomiting Anesthetic complications: no   No complications documented.   Last Vitals:  Vitals:   08/06/20 0856  BP: 138/71  Pulse: 60  Resp: 18  Temp: (!) 36.3 C  SpO2: 99%    Last Pain:  Vitals:   08/06/20 0856  TempSrc: Temporal  PainSc: 0-No pain                 Neva Seat

## 2020-08-06 NOTE — Interval H&P Note (Signed)
History and Physical Interval Note:  08/06/2020 9:10 AM  Shari Gave  has presented today for surgery, with the diagnosis of dyspepsia.  The various methods of treatment have been discussed with the patient and family. After consideration of risks, benefits and other options for treatment, the patient has consented to  Procedure(s): ESOPHAGOGASTRODUODENOSCOPY (EGD) WITH PROPOFOL (N/A) as a surgical intervention.  The patient's history has been reviewed, patient examined, no change in status, stable for surgery.  I have reviewed the patient's chart and labs.  Questions were answered to the patient's satisfaction.     Lesly Rubenstein  Ok to proceed with EGD

## 2020-08-06 NOTE — H&P (Signed)
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 This happened before but resolved on it's own. Was taking NSAIDS which she stopped recently. Minimal nausea. Will vomit both digested and undigested food. No blood thinners.    Current Facility-Administered Medications:  .  0.9 %  sodium chloride infusion, , Intravenous, Continuous, Elzora Cullins, Hilton Cork, MD, Last Rate: 20 mL/hr at 08/06/20 0907, Continued from Pre-op at 08/06/20 0907  Medications Prior to Admission  Medication Sig Dispense Refill Last Dose  . aspirin 81 MG tablet Take 81 mg by mouth daily.   08/05/2020 at Unknown time  . Cholecalciferol 1000 units capsule Take 1,000 Units by mouth daily.   08/05/2020 at Unknown time  . gabapentin (NEURONTIN) 300 MG capsule Take 300 mg by mouth 3 (three) times daily.    08/05/2020 at Unknown time  . Melatonin 10 MG TABS Take 10 mg by mouth at bedtime.   08/05/2020 at Unknown time  . Multiple Vitamin (MULTIVITAMIN) capsule Take 1 capsule by mouth daily.   08/05/2020 at Unknown time  . pantoprazole (PROTONIX) 20 MG tablet Take 20 mg by mouth daily.   08/01/20  . simvastatin (ZOCOR) 20 MG tablet Take 20 mg by mouth at bedtime.   08/05/2020 at Unknown time  . furosemide (LASIX) 20 MG tablet Take 20 mg by mouth as needed.      Marland Kitchen ibuprofen (ADVIL,MOTRIN) 200 MG tablet Take 600 mg by mouth every 12 (twelve) hours.  (Patient not taking: Reported on 08/06/2020)   Not Taking at Unknown time  . NORCO 5-325 MG tablet Take 1-2 tablets by mouth every 6 (six) hours as needed for moderate pain. MAXIMUM TOTAL ACETAMINOPHEN DOSE IS 4000 MG PER DAY (Patient not taking: Reported on 10/16/2019) 30 tablet 0   . predniSONE (STERAPRED UNI-PAK 21 TAB) 10 MG (21) TBPK tablet Take 10 mg by mouth daily with breakfast. 6 day  taper - Take as directed (Patient not taking: Reported on 08/06/2020)   Completed Course at Unknown time     Allergies  Allergen Reactions  . Sulfa Antibiotics Swelling, Anaphylaxis and Rash    Tongue, high fever Other reaction(s): Other (See Comments), Other (See Comments) Fever and tongue swells Fever and tongue swells  Other reaction(s): Other (See Comments) Fever and tongue swells Tongue, high fever Other reaction(s): Other (See Comments), Other (See Comments) Fever and tongue swells Fever and tongue swells  . Sulfasalazine Swelling    Tongue, high fever  . Tetanus Toxoids Anaphylaxis and Other (See Comments)    High fever     Past Medical History:  Diagnosis Date  . Arthritis    left knee  . Heart murmur    "slight" - followed by PCP  . History of hiatal hernia   . Hypercholesterolemia   . Lymphedema   . Stroke [facility])    Occlusion of left posterior cerebral artery  . Varicose vein of leg   . Wears contact lenses     Review of systems:  Otherwise negative.    Physical Exam  Gen: Alert, oriented. Appears stated age.  HEENT: PERRLA. Lungs: No respiratory distress CV: RRR Abd: soft, benign, no masses. Ext: No edema.    Planned procedures: Proceed with EGD. The patient understands the nature of the planned procedure, indications, risks, alternatives and potential complications including  but not limited to bleeding, infection, perforation, damage to internal organs and possible oversedation/side effects from anesthesia. The patient agrees and gives consent to proceed.  Please refer to procedure notes for findings, recommendations and patient disposition/instructions.     Raylene Miyamoto MD, MPH Gastroenterology 08/06/2020  9:08 AM

## 2020-08-07 ENCOUNTER — Ambulatory Visit
Admission: RE | Admit: 2020-08-07 | Discharge: 2020-08-07 | Disposition: A | Discharge status: Home / Self Care | Payer: BC Managed Care – PPO | Source: Ambulatory Visit | Attending: Gerontology | Admitting: Gerontology

## 2020-08-07 ENCOUNTER — Other Ambulatory Visit: Payer: Self-pay

## 2020-08-07 DIAGNOSIS — Z1231 Encounter for screening mammogram for malignant neoplasm of breast: Secondary | ICD-10-CM | POA: Insufficient documentation

## 2020-08-07 LAB — SURGICAL PATHOLOGY

## 2020-09-30 ENCOUNTER — Other Ambulatory Visit (HOSPITAL_COMMUNITY): Payer: Self-pay | Admitting: Physical Medicine & Rehabilitation

## 2020-09-30 ENCOUNTER — Other Ambulatory Visit: Payer: Self-pay | Admitting: Physical Medicine & Rehabilitation

## 2020-09-30 DIAGNOSIS — M5442 Lumbago with sciatica, left side: Secondary | ICD-10-CM

## 2020-09-30 DIAGNOSIS — G8929 Other chronic pain: Secondary | ICD-10-CM

## 2020-09-30 DIAGNOSIS — M5441 Lumbago with sciatica, right side: Secondary | ICD-10-CM

## 2020-10-02 ENCOUNTER — Ambulatory Visit: Payer: BC Managed Care – PPO | Attending: Physical Medicine & Rehabilitation

## 2020-10-02 ENCOUNTER — Other Ambulatory Visit: Payer: Self-pay

## 2020-10-02 DIAGNOSIS — M545 Low back pain, unspecified: Secondary | ICD-10-CM | POA: Insufficient documentation

## 2020-10-02 DIAGNOSIS — G8929 Other chronic pain: Secondary | ICD-10-CM | POA: Diagnosis present

## 2020-10-02 DIAGNOSIS — M6281 Muscle weakness (generalized): Secondary | ICD-10-CM | POA: Insufficient documentation

## 2020-10-02 DIAGNOSIS — M5441 Lumbago with sciatica, right side: Secondary | ICD-10-CM | POA: Diagnosis present

## 2020-10-02 NOTE — Therapy (Signed)
[place] [facility] [address] [place], [place], [address] Phone: [phone]   Fax:  [phone]  Physical Therapy Evaluation  Patient Details  Name: Shari Parks MRN: [id number] Date of Birth: June 20, 1966 Referring Provider (PT): Dr. Alba Destine   Encounter Date: 10/02/2020   PT End of Session - 10/02/20 1502    Visit Number 1    Number of Visits 17    Date for PT Re-Evaluation 11/27/20    Authorization Type eval: 10/03/19    PT Start Time 1105    PT Stop Time 1150    PT Time Calculation (min) 45 min    Activity Tolerance Patient tolerated treatment well    Behavior During Therapy [facility] for tasks assessed/performed           Past Medical History:  Diagnosis Date  . Arthritis    left knee  . Heart murmur    "slight" - followed by PCP  . History of hiatal hernia   . Hypercholesterolemia   . Lymphedema   . Stroke [facility])    Occlusion of left posterior cerebral artery  . Varicose vein of leg   . Wears contact lenses     Past Surgical History:  Procedure Laterality Date  . ABDOMINAL HYSTERECTOMY    . CHOLECYSTECTOMY  6/92  . COLONOSCOPY  04/30/14  . DIAGNOSTIC LAPAROSCOPY    . ESOPHAGOGASTRIC FUNDOPLASTY  2012  . ESOPHAGOGASTRODUODENOSCOPY (EGD) WITH PROPOFOL N/A 08/06/2020   Procedure: ESOPHAGOGASTRODUODENOSCOPY (EGD) WITH PROPOFOL;  Surgeon: Lesly Rubenstein, MD;  Location: [facility] ENDOSCOPY;  Service: Endoscopy;  Laterality: N/A;  . HAMMER TOE SURGERY Right 04/05/13  . KNEE ARTHROSCOPY Left 11/22/2015   Procedure: ARTHROSCOPY KNEE WITH CHONDROPLASTY MEDIAL FEMORAL CONDYLE;  Surgeon: Leanor Kail, MD;  Location: [place];  Service: Orthopedics;  Laterality: Left;  order of cases moved per CeCe and Dr. Leslye Peer  . LAPAROSCOPIC GASTRIC SLEEVE RESECTION  12/2010  . NOVASURE ABLATION  approx 2011  . TONSILLECTOMY  6/84    There were no vitals filed for this visit.    Subjective Assessment - 10/02/20 1129     Subjective Back pain    Pertinent History Patient is a very pleasant 55 y.o. who reports 1.5 years of low back pain. She did physical therapy last year for 1 month without relief. Her back pain has been worsening. She is s/p s/p right L4-5 TFESI 03/04/2020 with 50% relief initially but pain returned, right L4-5 TFESI 07/15/2020 with several days improvement, bilateral L5-S1 TFESI 08/16/2020 with left leg pain relief, but still with pain down the right leg, and right L4-5 TFESI 09/13/2020 with no relief. She reports pain in both sides of the lower back. No pain radiation down BLE at this time. She is reporting some weakness in her BLE and per patient she reports that MD noted more weakness in her RLE. She notices the weakness most notably when going up steps.. She denies any numbness, tingling or loss of control of bowel or bladder. She has been released from physiatry with a referral to see neurosurgeon. MRI lumbar spine 10/2019: L2-3 mild CCS; L3-4 mild bilateral NFS; L4-5 mild to moderate CCS, mild left and severe right NFS due to right disc extrusion; moderate facet arthropathy L3-4 and L4-5. She has another MRI scheduled for next week to assess her back.    Limitations Standing;Walking    How long can you sit comfortably? 1 hour    How long can you stand comfortably? 15  minutes    How long can you walk comfortably? 15 minutes    Diagnostic tests See history    Patient Stated Goals Decrease back pain    Currently in Pain? Yes    Pain Score 3    Worst: 8/10, Best: 2/10   Pain Location Back    Pain Orientation Right;Left;Lower    Pain Descriptors / Indicators Stabbing;Other (Comment)   Grinding   Pain Radiating Towards None currently but previously down BLE    Pain Onset More than a month ago    Pain Frequency Constant    Aggravating Factors  Walking unsupported, extended standing, sitting for >1 hour, getting into/out of her bed    Pain Relieving Factors hot shower (heat), after riding a stationary  bike, bending forward, Tylenol (no help), Gabapentin (no help);    Multiple Pain Sites No              [facility] PT Assessment - 10/02/20 1501      Assessment   Medical Diagnosis Spinal stenosis, 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 Alba Destine    Onset Date/Surgical Date 03/29/19   Approximate   Hand Dominance Right    Next MD Visit [place] to see neurosurgery    Prior Therapy Yes      Precautions   Precautions None      Restrictions   Weight Bearing Restrictions No      Balance Screen   Has the patient fallen in the past 6 months No    Has the patient had a decrease in activity level because of a fear of falling?  Yes    Is the patient reluctant to leave their home because of a fear of falling?  Yes      [place] Private residence    Living Arrangements Spouse/significant other    Available Help at Discharge Family      Prior Function   Level of Independence Independent      Cognition   Overall Cognitive Status Within Functional Limits for tasks assessed               SUBJECTIVE Chief complaint: Low back pain History:  Patient is a very pleasant 55 y.o. who reports 1.5 years of low back pain. She did physical therapy last year for 1 month without relief. Her back pain has been worsening. She is s/p s/p right L4-5 TFESI 03/04/2020 with 50% relief initially but pain returned, right L4-5 TFESI 07/15/2020 with several days improvement, bilateral L5-S1 TFESI 08/16/2020 with left leg pain relief, but still with pain down the right leg, and right L4-5 TFESI 09/13/2020 with no relief. She reports pain in both sides of the lower back. No pain radiation down BLE at this time. She is reporting some weakness in her BLE and per patient she reports that MD noted more weakness in her RLE. She notices the weakness most notably when going up steps.. She denies any numbness,  tingling or loss of control of bowel or bladder. She has been released from physiatry with a referral to see neurosurgeon. MRI lumbar spine 10/2019: L2-3 mild CCS; L3-4 mild bilateral NFS; L4-5 mild to moderate CCS, mild left and severe right NFS due to right disc extrusion; moderate facet arthropathy L3-4 and L4-5. She has another MRI scheduled for next week for updated imaging of her spine. Referring Dx: Spinal stenosis, lumbar without neurogenic claudication, lumbago with  bilateral sciatica, other chronic pain Referring Provider: Girtha Hake Pain location: Bilateral low back Pain: Present 3/10, Best 2/10, Worst 8/10 Pain quality: pain quality: stabbing, grinding Radiating pain: Yes, previously radiating down BLE but improved now Numbness/Tingling: No 24 hour pain behavior: Patient reports pain at night when waking up to go to the bathroom and transferring in and out of bed Aggravating factors: Walking unsupported, extended standing, sitting for >1 hour, getting into/out of her bed Easing factors: hot shower (heat), after riding a stationary bike, bending forward, Tylenol (no help), Gabapentin (no help); How long can you sit: 1 hour How long can you stand: 15 minutes How long can you walk: 15 minutes History of back injury, pain, surgery, or therapy: No Dominant hand: right Imaging: Yes, see history  Falls in the last 6 months: No  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  Recent memory is intact.  Remote memory is intact.  Attention span and concentration are intact.  Expressive speech is intact.  Patient's fund of knowledge is  within normal limits for educational level.  SENSATION: Deferred   MUSCULOSKELETAL: Tremor: None Bulk: Normal Tone: Normal No visible step-off along spinal column  Posture Lumbar lordosis: WNL Iliac crest height: Slightly higher on L isde Lumbar lateral shift: negative  Gait Wide based gait, decreased step length and speed   Palpation Denies pain with palpation to lumbar paraspinals L1-L5, bilateral posterior external rotators of hips, and bilateral posterior thighs.   Strength (out of 5) R/L 4-/4 Hip flexion 4/4+ Hip ER 4/4+ Hip IR 4-/4 Hip abduction 4-/4+ Hip adduction 3+/4 Hip extension 5/5 Knee extension 4-/4 Knee flexion 4/4- Ankle dorsiflexion *Indicates pain    AROM (degrees) R/L (all movements include overpressure unless otherwise stated) Lumbar forward flexion (65): Decreased segmental flexion, pain with overpressure Lumbar extension (30): Decreased lumbar extension, pain with overpressure Lumbar lateral flexion (25): WNL but painful with overpressure bilaterally Thoracic and Lumbar rotation (30 degrees): WNL Hip IR (0-45): WNL bilateral Hip ER (0-45): WNL bilateral Hip Flexion (0-125): WNL bilateral, painful on the R *Indicates pain  Repeated Movements Deferred   Muscle Length Hamstrings: R: 70 degrees L: 80 degrees  [place]: Positive bilaterally for tightness but no back pain   Passive Accessory Intervertebral Motion (PAIVM) Pt denies reproduction of back pain with CPA L1-L3, positive reproduction of pain at L4-L5. Pt also denies pain with UPA bilaterally L1-L3 but reports pain with UPA bilaterally L4-L5. Generally hypomobile throughout    SPECIAL TESTS Lumbar Radiculopathy and Discogenic: Centralization and Peripheralization (SN 92, -LR 0.12): Not examined Slump (SN 83, -LR 0.32): R: Positive L: Negative SLR (SN 92, -LR 0.29): R: Positive (40 degrees) L:  Negative Crossed SLR (SP 90): R: Negative L: Negative  Facet Joint: Extension-Rotation  (SN 100, -LR 0.0): R: Positive L: Positive  Lumbar Spinal Stenosis: Lumbar quadrant (SN 70): R: Positive L: Positive  Hip: FABER (SN 81): R: Positive L: Negative FADIR (SN 94): R: Negative L: Negative Hip scour (SN 50): R: Positive for back pain L: Negative  SIJ:  Thigh Thrust (SN 88, -LR 0.18) : R: Not done L: Not done  Piriformis Syndrome: FAIR Test (SN 88, SP 83): R:  Not done L: Not done  Functional Tasks Deferred  FOTO: 35, predicted improvement to 55; MODI: 46%              Objective measurements completed on examination: See above findings.               PT Education - 10/02/20 1502    Education Details Plan of care, reviewed central vs foraminal stenosis    Person(s) Educated Patient    Methods Explanation    Comprehension Verbalized understanding            PT Short Term Goals - 10/02/20 1506      PT SHORT TERM GOAL #1   Title Pt will be independent with HEP in order to improve strength and decrease back pain in order to improve pain-free function at home.    Time 4    Period Weeks    Status New    Target Date 10/30/20             PT Long Term Goals - 10/02/20 1506      PT LONG TERM GOAL #1   Title Pt will decrease mODI score by at least 13 percent in order demonstrate clinically significant reduction in back pain/disability.    Baseline 10/02/20: 46%    Time 8    Period Weeks    Status New    Target Date 11/27/20      PT LONG TERM GOAL #2   Title Pt will decrease worst back pain as reported on NPRS by at least 2 points in order to demonstrate clinically significant reduction in back pain.    Baseline 10/02/20: 8/10    Time 8    Period Weeks    Status New    Target Date 11/27/20      PT LONG TERM GOAL #3   Title Patient improved FOTO to at least 55 in order to demonstrate significant provement in function    Baseline 10/02/20: 35    Time 8    Period Weeks    Status New    Target Date 11/27/20                   Plan - 10/02/20 1503    Clinical Impression Statement Pt is a pleasant 55 year-old female referred for low back pain.  Last lumbar MRI performed in 2021 revealed mild to moderate central canal stenosis as well as severe foraminal stenosis on the right as well as moderate facet arthropathy. PT examination reveals some neural compression with positive slump and straight leg raise test on the right side.  She is painful to central and unilateral passive accessory mobility testing at L4-L5 bilaterally.  Generally mild decrease in right lower extremity strength compared to left side.  Patient was referred to physical therapy and neurosurgery after failed management with epidural injections.  He has previously failed physical therapy management.  Pt presents with deficits in pain and strength and will benefit from skilled PT services to address these deficits and return to pain-free function at home and with leisure activities.    Personal Factors and Comorbidities Comorbidity 3+    Comorbidities CVA, lymphedema, OA, obesity    Examination-Activity Limitations Bed Mobility;Bend;Caring for Others;Squat;Stairs;Stand;Transfers    Examination-Participation Restrictions Community Activity;Other   Traveling   Stability/Clinical Decision Making Evolving/Moderate complexity    Clinical Decision Making Moderate    Rehab Potential Good    PT Frequency 2x / week    PT Duration 8  weeks    PT Treatment/Interventions ADLs/Self Care Home Management;Aquatic Therapy;Biofeedback;Canalith Repostioning;Cryotherapy;Electrical Stimulation;Iontophoresis 4mg /ml Dexamethasone;Moist Heat;Traction;Ultrasound;DME Instruction;Gait training;Stair training;Functional mobility training;Therapeutic exercise;Therapeutic activities;Balance training;Neuromuscular re-education;Patient/family education;Manual techniques;Dry needling;Vestibular;Joint Manipulations;Spinal Manipulations    PT Next Visit Plan Lower lumbar joint mobilizations, lumbar  stabilization, consider TDN, initiate HEP    PT Home Exercise Plan None currently    Consulted and Agree with Plan of Care Patient           Patient will benefit from skilled therapeutic intervention in order to improve the following deficits and impairments:  Decreased strength,Pain  Visit Diagnosis: Chronic bilateral low back pain, unspecified whether sciatica present  Muscle weakness (generalized)     Problem List Patient Active Problem List   Diagnosis Date Noted  . Varicose veins 04/22/2020  . Chronic right-sided low back pain with right-sided sciatica 01/18/2020  . Foraminal stenosis of lumbar region 01/18/2020  . Lumbar disc herniation with radiculopathy 01/18/2020  . Lymphedema 10/16/2019  . Chronic venous insufficiency 10/16/2019  . DJD (degenerative joint disease) 10/16/2019  . Radicular leg pain 10/16/2019  . Ovarian fibroma, right 09/24/2017  . Post-operative state 09/24/2017  . Occlusion of left posterior cerebral artery 09/17/2017  . Occipital infarction ([place]) 09/04/2017  . Sudden visual loss, right eye 09/02/2017  . Cellulitis 08/28/2017  . Intra-abdominal and pelvic swelling, mass and lump, unspecified site 08/24/2017  . Bilateral foot pain 01/12/2017  . Burning sensation of feet 01/12/2017  . Hernia, hiatal 11/06/2016  . Morbid obesity with BMI of 40.0-44.9, adult ([place]) 07/01/2016  . Pain in toe of right foot 12/19/2015  . Trigger ring finger of left hand 11/28/2015  . Tear of meniscus of knee 07/03/2015  . Pain in left knee 06/04/2015  . Primary osteoarthritis of left knee 06/04/2015  . Ganglion cyst of foot 02/11/2015  . Hammer toe of right foot 02/11/2015  . Cardiac murmur 03/22/2014  . Vitamin D deficiency 05/16/2013   Phillips Grout PT, DPT, GCS  Koriana Stepien 10/02/2020, 3:23 PM  [facility] [address] [place], [place], [address] Phone: [phone]   Fax:  [phone]  Name: Shari Parks MRN: [id number] Date of Birth: 1966-05-07

## 2020-10-08 ENCOUNTER — Ambulatory Visit: Admission: RE | Admit: 2020-10-08 | Payer: BC Managed Care – PPO | Source: Ambulatory Visit

## 2020-10-10 ENCOUNTER — Encounter: Payer: Self-pay | Admitting: Physical Therapy

## 2020-10-10 ENCOUNTER — Other Ambulatory Visit: Payer: Self-pay

## 2020-10-10 ENCOUNTER — Ambulatory Visit: Payer: BC Managed Care – PPO | Admitting: Physical Therapy

## 2020-10-10 DIAGNOSIS — M6281 Muscle weakness (generalized): Secondary | ICD-10-CM

## 2020-10-10 DIAGNOSIS — M545 Low back pain, unspecified: Secondary | ICD-10-CM | POA: Diagnosis not present

## 2020-10-10 DIAGNOSIS — G8929 Other chronic pain: Secondary | ICD-10-CM

## 2020-10-10 NOTE — Therapy (Signed)
[facility] [address] [place], [place], [address] Phone: [phone]   Fax:  [phone]  Physical Therapy Treatment  Patient Details  Name: Shari Parks MRN: [id number] Date of Birth: 1966-04-22 Referring Provider (PT): Dr. Alba Destine   Encounter Date: 10/10/2020   PT End of Session - 10/10/20 0748    Visit Number 2    Number of Visits 17    Date for PT Re-Evaluation 11/27/20    Authorization Type eval: 10/03/19    Authorization - Visit Number 2    Authorization - Number of Visits 10    PT Start Time 0729    PT Stop Time 0822    PT Time Calculation (min) 53 min    Activity Tolerance Patient tolerated treatment well    Behavior During Therapy [facility] for tasks assessed/performed           Past Medical History:  Diagnosis Date  . Arthritis    left knee  . Heart murmur    "slight" - followed by PCP  . History of hiatal hernia   . Hypercholesterolemia   . Lymphedema   . Stroke [place])    Occlusion of left posterior cerebral artery  . Varicose vein of leg   . Wears contact lenses     Past Surgical History:  Procedure Laterality Date  . ABDOMINAL HYSTERECTOMY    . CHOLECYSTECTOMY  6/92  . COLONOSCOPY  04/30/14  . DIAGNOSTIC LAPAROSCOPY    . ESOPHAGOGASTRIC FUNDOPLASTY  2012  . ESOPHAGOGASTRODUODENOSCOPY (EGD) WITH PROPOFOL N/A 08/06/2020   Procedure: ESOPHAGOGASTRODUODENOSCOPY (EGD) WITH PROPOFOL;  Surgeon: Lesly Rubenstein, MD;  Location: [facility];  Service: Endoscopy;  Laterality: N/A;  . HAMMER TOE SURGERY Right 04/05/13  . KNEE ARTHROSCOPY Left 11/22/2015   Procedure: ARTHROSCOPY KNEE WITH CHONDROPLASTY MEDIAL FEMORAL CONDYLE;  Surgeon: Leanor Kail, MD;  Location: [facility];  Service: Orthopedics;  Laterality: Left;  order of cases moved per CeCe and Dr. Leslye Peer  . LAPAROSCOPIC GASTRIC SLEEVE RESECTION  12/2010  . NOVASURE ABLATION  approx 2011  . TONSILLECTOMY  6/84    There were no vitals  filed for this visit.   Subjective Assessment - 10/10/20 0737    Subjective Pt. reports 2/10 R low back/ SI pain while walking around PT clinic.  Pt. reports pain is not too bad this morning.    Pertinent History Patient is a very pleasant 55 y.o. who reports 1.5 years of low back pain. She did physical therapy last year for 1 month without relief. Her back pain has been worsening. She is s/p s/p right L4-5 TFESI 03/04/2020 with 50% relief initially but pain returned, right L4-5 TFESI 07/15/2020 with several days improvement, bilateral L5-S1 TFESI 08/16/2020 with left leg pain relief, but still with pain down the right leg, and right L4-5 TFESI 09/13/2020 with no relief. She reports pain in both sides of the lower back. No pain radiation down BLE at this time. She is reporting some weakness in her BLE and per patient she reports that MD noted more weakness in her RLE. She notices the weakness most notably when going up steps.. She denies any numbness, tingling or loss of control of bowel or bladder. She has been released from physiatry with a referral to see neurosurgeon. MRI lumbar spine 10/2019: L2-3 mild CCS; L3-4 mild bilateral NFS; L4-5 mild to moderate CCS, mild left and severe right NFS due to right disc extrusion; moderate facet arthropathy L3-4 and L4-5. She  has another MRI scheduled for next week to assess her back.    Limitations Standing;Walking    How long can you sit comfortably? 1 hour    How long can you stand comfortably? 15 minutes    How long can you walk comfortably? 15 minutes    Diagnostic tests See history    Patient Stated Goals Decrease back pain    Currently in Pain? Yes    Pain Score 2     Pain Location Back    Pain Orientation Right    Pain Descriptors / Indicators Stabbing    Pain Onset More than a month ago           Manual tx.:  Supine LE/lumbar stretches: generalized with focus on piriformis/ sciatic nerve glides.  (14 minutes).     There.ex.:  Nustep L4 10  min. B UE/LE (discussed history/ gym based ex.)  Supine TrA ex.: TrA instruction/ pelvic tilts/ heel slides 10x each.  Seated TrA ex.: TrA activation/ pelvic tilts/ posture correction (mirror feedback).   See HEP     PT Education - 10/10/20 4742    Education Details Supine/seated core ex. (basics page #1)    Person(s) Educated Patient    Methods Explanation;Demonstration;Handout    Comprehension Verbalized understanding;Returned demonstration            PT Short Term Goals - 10/02/20 1506      PT SHORT TERM GOAL #1   Title Pt will be independent with HEP in order to improve strength and decrease back pain in order to improve pain-free function at home.    Time 4    Period Weeks    Status New    Target Date 10/30/20             PT Long Term Goals - 10/02/20 1506      PT LONG TERM GOAL #1   Title Pt will decrease mODI score by at least 13 percent in order demonstrate clinically significant reduction in back pain/disability.    Baseline 10/02/20: 46%    Time 8    Period Weeks    Status New    Target Date 11/27/20      PT LONG TERM GOAL #2   Title Pt will decrease worst back pain as reported on NPRS by at least 2 points in order to demonstrate clinically significant reduction in back pain.    Baseline 10/02/20: 8/10    Time 8    Period Weeks    Status New    Target Date 11/27/20      PT LONG TERM GOAL #3   Title Patient improved FOTO to at least 55 in order to demonstrate significant provement in function    Baseline 10/02/20: 35    Time 8    Period Weeks    Status New    Target Date 11/27/20                 Plan - 10/10/20 0748    Clinical Impression Statement Pt presented with good B HS and piriformis flexibility, B hip IR, ER, and flexion ROM, and B sciatic nerve mobility today evident during manual stretching. Pt required verbal and tactile cues during TA contractions today. Pt was able to hold a static TA contraction and TA contraction with pelvic  tilts today, however showed decreased core activation and endurance noted evident during TA contraction with heel slides. Deficits in core endurance and activation r/i increased stress on the lumbar spine  during ADLs. Pt reported no increased pain with initiated ex and HEP today.    Personal Factors and Comorbidities Comorbidity 3+    Comorbidities CVA, lymphedema, OA, obesity    Examination-Activity Limitations Bed Mobility;Bend;Caring for Others;Squat;Stairs;Stand;Transfers    Examination-Participation Restrictions Community Activity;Other   Traveling   Stability/Clinical Decision Making Evolving/Moderate complexity    Clinical Decision Making Moderate    Rehab Potential Good    PT Frequency 2x / week    PT Duration 8 weeks    PT Treatment/Interventions ADLs/Self Care Home Management;Aquatic Therapy;Biofeedback;Canalith Repostioning;Cryotherapy;Electrical Stimulation;Iontophoresis 4mg /ml Dexamethasone;Moist Heat;Traction;Ultrasound;DME Instruction;Gait training;Stair training;Functional mobility training;Therapeutic exercise;Therapeutic activities;Balance training;Neuromuscular re-education;Patient/family education;Manual techniques;Dry needling;Vestibular;Joint Manipulations;Spinal Manipulations    PT Next Visit Plan Lower lumbar joint mobilizations, lumbar stabilization, consider TDN, initiate HEP.   CHECK MRI RESULTS    PT Home Exercise Plan None currently    Consulted and Agree with Plan of Care Patient           Patient will benefit from skilled therapeutic intervention in order to improve the following deficits and impairments:  Decreased strength,Pain  Visit Diagnosis: Chronic bilateral low back pain, unspecified whether sciatica present  Muscle weakness (generalized)     Problem List Patient Active Problem List   Diagnosis Date Noted  . Varicose veins 04/22/2020  . Chronic right-sided low back pain with right-sided sciatica 01/18/2020  . Foraminal stenosis of lumbar region  01/18/2020  . Lumbar disc herniation with radiculopathy 01/18/2020  . Lymphedema 10/16/2019  . Chronic venous insufficiency 10/16/2019  . DJD (degenerative joint disease) 10/16/2019  . Radicular leg pain 10/16/2019  . Ovarian fibroma, right 09/24/2017  . Post-operative state 09/24/2017  . Occlusion of left posterior cerebral artery 09/17/2017  . Occipital infarction ([place]) 09/04/2017  . Sudden visual loss, right eye 09/02/2017  . Cellulitis 08/28/2017  . Intra-abdominal and pelvic swelling, mass and lump, unspecified site 08/24/2017  . Bilateral foot pain 01/12/2017  . Burning sensation of feet 01/12/2017  . Hernia, hiatal 11/06/2016  . Morbid obesity with BMI of 40.0-44.9, adult ([place]) 07/01/2016  . Pain in toe of right foot 12/19/2015  . Trigger ring finger of left hand 11/28/2015  . Tear of meniscus of knee 07/03/2015  . Pain in left knee 06/04/2015  . Primary osteoarthritis of left knee 06/04/2015  . Ganglion cyst of foot 02/11/2015  . Hammer toe of right foot 02/11/2015  . Cardiac murmur 03/22/2014  . Vitamin D deficiency 05/16/2013   Pura Spice, PT, DPT # [id number] 10/10/2020, 12:06 PM  [facility] [address] [place], [place], [address] Phone: [phone]   Fax:  [phone]  Name: Shari Parks MRN: [id number] Date of Birth: [birth year]

## 2020-10-10 NOTE — Patient Instructions (Signed)
Access Code: [email or web address]: [email or web address]: 01/13/2022Prepared by: Legrand Como SherkExercises  Seated Transversus Abdominis Bracing - 1 x daily - 7 x weekly - 1 sets - 10 reps  Seated Pelvic Tilt - 1 x daily - 7 x weekly - 1 sets - 10 reps

## 2020-10-14 ENCOUNTER — Ambulatory Visit
Admission: RE | Admit: 2020-10-14 | Discharge: 2020-10-14 | Disposition: A | Discharge status: Home / Self Care | Payer: BC Managed Care – PPO | Source: Ambulatory Visit | Attending: Physical Medicine & Rehabilitation | Admitting: Physical Medicine & Rehabilitation

## 2020-10-14 ENCOUNTER — Other Ambulatory Visit: Payer: Self-pay

## 2020-10-14 DIAGNOSIS — G8929 Other chronic pain: Secondary | ICD-10-CM | POA: Insufficient documentation

## 2020-10-14 DIAGNOSIS — M5442 Lumbago with sciatica, left side: Secondary | ICD-10-CM | POA: Insufficient documentation

## 2020-10-14 DIAGNOSIS — M5441 Lumbago with sciatica, right side: Secondary | ICD-10-CM | POA: Insufficient documentation

## 2020-10-15 ENCOUNTER — Ambulatory Visit: Payer: BC Managed Care – PPO | Admitting: Physical Therapy

## 2020-10-17 ENCOUNTER — Ambulatory Visit: Payer: BC Managed Care – PPO | Admitting: Physical Therapy

## 2020-10-17 ENCOUNTER — Encounter: Payer: Self-pay | Admitting: Physical Therapy

## 2020-10-17 ENCOUNTER — Other Ambulatory Visit: Payer: Self-pay

## 2020-10-17 DIAGNOSIS — G8929 Other chronic pain: Secondary | ICD-10-CM

## 2020-10-17 DIAGNOSIS — M545 Low back pain, unspecified: Secondary | ICD-10-CM | POA: Diagnosis not present

## 2020-10-17 DIAGNOSIS — M6281 Muscle weakness (generalized): Secondary | ICD-10-CM

## 2020-10-17 NOTE — Therapy (Signed)
[facility] [address] [place], [place], [address] Phone: [phone]   Fax:  [phone]  Physical Therapy Treatment  Patient Details  Name: Shari Parks MRN: [id number] Date of Birth: [birth year] Referring Provider (PT): Dr. Alba Destine   Encounter Date: 10/17/2020   PT End of Session - 10/17/20 0746    Visit Number 3    Number of Visits 17    Date for PT Re-Evaluation 11/27/20    Authorization Type eval: 10/03/19    Authorization - Visit Number 3    Authorization - Number of Visits 10    PT Start Time 0728    PT Stop Time 0815    PT Time Calculation (min) 47 min    Activity Tolerance Patient tolerated treatment well    Behavior During Therapy [facility] for tasks assessed/performed           Past Medical History:  Diagnosis Date  . Arthritis    left knee  . Heart murmur    "slight" - followed by PCP  . History of hiatal hernia   . Hypercholesterolemia   . Lymphedema   . Stroke [facility])    Occlusion of left posterior cerebral artery  . Varicose vein of leg   . Wears contact lenses     Past Surgical History:  Procedure Laterality Date  . ABDOMINAL HYSTERECTOMY    . CHOLECYSTECTOMY  6/92  . COLONOSCOPY  04/30/14  . DIAGNOSTIC LAPAROSCOPY    . ESOPHAGOGASTRIC FUNDOPLASTY  2012  . ESOPHAGOGASTRODUODENOSCOPY (EGD) WITH PROPOFOL N/A 08/06/2020   Procedure: ESOPHAGOGASTRODUODENOSCOPY (EGD) WITH PROPOFOL;  Surgeon: Lesly Rubenstein, MD;  Location: [facility];  Service: Endoscopy;  Laterality: N/A;  . HAMMER TOE SURGERY Right 04/05/13  . KNEE ARTHROSCOPY Left 11/22/2015   Procedure: ARTHROSCOPY KNEE WITH CHONDROPLASTY MEDIAL FEMORAL CONDYLE;  Surgeon: Leanor Kail, MD;  Location: [facility];  Service: Orthopedics;  Laterality: Left;  order of cases moved per CeCe and Dr. Leslye Peer  . LAPAROSCOPIC GASTRIC SLEEVE RESECTION  12/2010  . NOVASURE ABLATION  approx 2011  . TONSILLECTOMY  6/84    There were no vitals  filed for this visit.   Subjective Assessment - 10/17/20 0741    Subjective Pt. reports 3/10 R low back/ SI pain this morning.  Pt. states she did not sleep well last night due to back pain.  Pt. received MRI results and referred to Neurologist/ Pain Mgmt.  Pt. will continue with PT treatment session.    Pertinent History Patient is a very pleasant 55 y.o. who reports 1.5 years of low back pain. She did physical therapy last year for 1 month without relief. Her back pain has been worsening. She is s/p s/p right L4-5 TFESI 03/04/2020 with 50% relief initially but pain returned, right L4-5 TFESI 07/15/2020 with several days improvement, bilateral L5-S1 TFESI 08/16/2020 with left leg pain relief, but still with pain down the right leg, and right L4-5 TFESI 09/13/2020 with no relief. She reports pain in both sides of the lower back. No pain radiation down BLE at this time. She is reporting some weakness in her BLE and per patient she reports that MD noted more weakness in her RLE. She notices the weakness most notably when going up steps.. She denies any numbness, tingling or loss of control of bowel or bladder. She has been released from physiatry with a referral to see neurosurgeon. MRI lumbar spine 10/2019: L2-3 mild CCS; L3-4 mild bilateral NFS; L4-5 mild to  moderate CCS, mild left and severe right NFS due to right disc extrusion; moderate facet arthropathy L3-4 and L4-5. She has another MRI scheduled for next week to assess her back.    Limitations Standing;Walking    How long can you sit comfortably? 1 hour    How long can you stand comfortably? 15 minutes    How long can you walk comfortably? 15 minutes    Diagnostic tests See history    Patient Stated Goals Decrease back pain    Currently in Pain? Yes    Pain Score 3     Pain Location Back    Pain Orientation Right    Pain Type Chronic pain    Pain Onset More than a month ago            Manual tx.:  Supine LE/lumbar stretches: generalized  with focus on piriformis/ sciatic nerve glides.  (13 minutes).     There.ex.:  Nustep L4 10 min. B UE/LE (discussed MRI/ POC to see Neurologist and Pain Mgmt.)  Supine TrA ex.: TrA instruction (tactile cuing provided with verbal feedback)/ hip abduction with RTB 10x each.  Seated TrA ex.: TrA activation/ pelvic tilts/ sit to stands/ standing marching/ lateral walking  (mirror feedback).   See HEP    PT Short Term Goals - 10/02/20 1506      PT SHORT TERM GOAL #1   Title Pt will be independent with HEP in order to improve strength and decrease back pain in order to improve pain-free function at home.    Time 4    Period Weeks    Status New    Target Date 10/30/20             PT Long Term Goals - 10/02/20 1506      PT LONG TERM GOAL #1   Title Pt will decrease mODI score by at least 13 percent in order demonstrate clinically significant reduction in back pain/disability.    Baseline 10/02/20: 46%    Time 8    Period Weeks    Status New    Target Date 11/27/20      PT LONG TERM GOAL #2   Title Pt will decrease worst back pain as reported on NPRS by at least 2 points in order to demonstrate clinically significant reduction in back pain.    Baseline 10/02/20: 8/10    Time 8    Period Weeks    Status New    Target Date 11/27/20      PT LONG TERM GOAL #3   Title Patient improved FOTO to at least 55 in order to demonstrate significant provement in function    Baseline 10/02/20: 35    Time 8    Period Weeks    Status New    Target Date 11/27/20                 Plan - 10/17/20 0746    Clinical Impression Statement Initial part of PT tx. focused on proper TrA muscle activation, esp. with LE movement.  PT progressed HEP with resisted glut. ex. and standing hip flexion.  Good tx. endurance on Nustep and with progression to standing ther.exKermit Balo LE muscle flexibility with added nerve glides.    Personal Factors and Comorbidities Comorbidity 3+    Comorbidities  CVA, lymphedema, OA, obesity    Examination-Activity Limitations Bed Mobility;Bend;Caring for Others;Squat;Stairs;Stand;Transfers    Examination-Participation Restrictions Community Activity;Other   Traveling   Stability/Clinical Decision Making Evolving/Moderate  complexity    Clinical Decision Making Moderate    Rehab Potential Good    PT Frequency 2x / week    PT Duration 8 weeks    PT Treatment/Interventions ADLs/Self Care Home Management;Aquatic Therapy;Biofeedback;Canalith Repostioning;Cryotherapy;Electrical Stimulation;Iontophoresis 4mg /ml Dexamethasone;Moist Heat;Traction;Ultrasound;DME Instruction;Gait training;Stair training;Functional mobility training;Therapeutic exercise;Therapeutic activities;Balance training;Neuromuscular re-education;Patient/family education;Manual techniques;Dry needling;Vestibular;Joint Manipulations;Spinal Manipulations    PT Next Visit Plan Lower lumbar joint mobilizations, lumbar stabilization, consider TDN, initiate HEP.    PT Home Exercise Plan None currently    Consulted and Agree with Plan of Care Patient           Patient will benefit from skilled therapeutic intervention in order to improve the following deficits and impairments:  Decreased strength,Pain  Visit Diagnosis: Chronic bilateral low back pain, unspecified whether sciatica present  Muscle weakness (generalized)     Problem List Patient Active Problem List   Diagnosis Date Noted  . Varicose veins 04/22/2020  . Chronic right-sided low back pain with right-sided sciatica 01/18/2020  . Foraminal stenosis of lumbar region 01/18/2020  . Lumbar disc herniation with radiculopathy 01/18/2020  . Lymphedema 10/16/2019  . Chronic venous insufficiency 10/16/2019  . DJD (degenerative joint disease) 10/16/2019  . Radicular leg pain 10/16/2019  . Ovarian fibroma, right 09/24/2017  . Post-operative state 09/24/2017  . Occlusion of left posterior cerebral artery 09/17/2017  . Occipital  infarction ([place]) 09/04/2017  . Sudden visual loss, right eye 09/02/2017  . Cellulitis 08/28/2017  . Intra-abdominal and pelvic swelling, mass and lump, unspecified site 08/24/2017  . Bilateral foot pain 01/12/2017  . Burning sensation of feet 01/12/2017  . Hernia, hiatal 11/06/2016  . Morbid obesity with BMI of 40.0-44.9, adult ([place]) 07/01/2016  . Pain in toe of right foot 12/19/2015  . Trigger ring finger of left hand 11/28/2015  . Tear of meniscus of knee 07/03/2015  . Pain in left knee 06/04/2015  . Primary osteoarthritis of left knee 06/04/2015  . Ganglion cyst of foot 02/11/2015  . Hammer toe of right foot 02/11/2015  . Cardiac murmur 03/22/2014  . Vitamin D deficiency 05/16/2013   Pura Spice, PT, DPT # [id number] 10/17/2020, 8:28 AM  [place] [address] [place], [place], [address] Phone: [phone]   Fax:  [phone]  Name: Shari Parks MRN: [id number] Date of Birth: 02/07/1966

## 2020-10-22 ENCOUNTER — Encounter: Payer: Self-pay | Admitting: Physical Therapy

## 2020-10-22 ENCOUNTER — Other Ambulatory Visit: Payer: Self-pay

## 2020-10-22 ENCOUNTER — Ambulatory Visit: Payer: BC Managed Care – PPO | Admitting: Physical Therapy

## 2020-10-22 DIAGNOSIS — M545 Low back pain, unspecified: Secondary | ICD-10-CM | POA: Diagnosis not present

## 2020-10-22 DIAGNOSIS — M6281 Muscle weakness (generalized): Secondary | ICD-10-CM

## 2020-10-22 DIAGNOSIS — G8929 Other chronic pain: Secondary | ICD-10-CM

## 2020-10-22 NOTE — Therapy (Signed)
[facility] [address] [place], [place], [address] Phone: [phone]   Fax:  [phone]  Physical Therapy Treatment  Patient Details  Name: Shari PaxLisa Allen Noy MRN: [id number] Date of Birth: Dec 05, 1965 Referring Provider (PT): Dr. Mariah MillingMorales   Encounter Date: 10/22/2020   PT End of Session - 10/22/20 0813    Visit Number 4    Number of Visits 17    Date for PT Re-Evaluation 11/27/20    Authorization Type eval: 10/03/19    Authorization - Visit Number 4    Authorization - Number of Visits 10    PT Start Time 0730    PT Stop Time 0811    PT Time Calculation (min) 41 min    Activity Tolerance Patient tolerated treatment well    Behavior During Therapy [facility] for tasks assessed/performed           Past Medical History:  Diagnosis Date  . Arthritis    left knee  . Heart murmur    "slight" - followed by PCP  . History of hiatal hernia   . Hypercholesterolemia   . Lymphedema   . Stroke [facility])    Occlusion of left posterior cerebral artery  . Varicose vein of leg   . Wears contact lenses     Past Surgical History:  Procedure Laterality Date  . ABDOMINAL HYSTERECTOMY    . CHOLECYSTECTOMY  6/92  . COLONOSCOPY  04/30/14  . DIAGNOSTIC LAPAROSCOPY    . ESOPHAGOGASTRIC FUNDOPLASTY  2012  . ESOPHAGOGASTRODUODENOSCOPY (EGD) WITH PROPOFOL N/A 08/06/2020   Procedure: ESOPHAGOGASTRODUODENOSCOPY (EGD) WITH PROPOFOL;  Surgeon: Regis BillLocklear, Cameron T, MD;  Location: [facility] ENDOSCOPY;  Service: Endoscopy;  Laterality: N/A;  . HAMMER TOE SURGERY Right 04/05/13  . KNEE ARTHROSCOPY Left 11/22/2015   Procedure: ARTHROSCOPY KNEE WITH CHONDROPLASTY MEDIAL FEMORAL CONDYLE;  Surgeon: Erin SonsHarold Kernodle, MD;  [facility];  Service: Orthopedics;  Laterality: Left;  order of cases moved per CeCe and Dr. Matilde BashGratian  . LAPAROSCOPIC GASTRIC SLEEVE RESECTION  12/2010  . NOVASURE ABLATION  approx 2011  . TONSILLECTOMY  6/84    There were no vitals  filed for this visit.   Subjective Assessment - 10/22/20 0733    Subjective Pt. reports 3/10 R low back/ SI pain this morning and states she continues to have trouble sleeping because she cannot get comfortable. Pt states she goes to the gym 4 days a week currently. Pt states she overall feels better since starting PT and she can be on her feet a little longer.    Pertinent History Patient is a very pleasant 55 y.o. who reports 1.5 years of low back pain. She did physical therapy last year for 1 month without relief. Her back pain has been worsening. She is s/p s/p right L4-5 TFESI 03/04/2020 with 50% relief initially but pain returned, right L4-5 TFESI 07/15/2020 with several days improvement, bilateral L5-S1 TFESI 08/16/2020 with left leg pain relief, but still with pain down the right leg, and right L4-5 TFESI 09/13/2020 with no relief. She reports pain in both sides of the lower back. No pain radiation down BLE at this time. She is reporting some weakness in her BLE and per patient she reports that MD noted more weakness in her RLE. She notices the weakness most notably when going up steps.. She denies any numbness, tingling or loss of control of bowel or bladder. She has been released from physiatry with a referral to see neurosurgeon. MRI lumbar spine  10/2019: L2-3 mild CCS; L3-4 mild bilateral NFS; L4-5 mild to moderate CCS, mild left and severe right NFS due to right disc extrusion; moderate facet arthropathy L3-4 and L4-5. She has another MRI scheduled for next week to assess her back.    Limitations Standing;Walking    How long can you sit comfortably? 1 hour    How long can you stand comfortably? 15 minutes    How long can you walk comfortably? 15 minutes    Diagnostic tests See history    Patient Stated Goals Decrease back pain    Currently in Pain? Yes    Pain Score 3     Pain Location Back    Pain Orientation Lower;Right    Pain Onset More than a month ago             Manual  tx.:  Supine LE/lumbar stretches: generalized with focus on piriformis/ sciatic nerve glides. (13 minutes).    There.ex.:  Attempted bridges, but pt had increased pain  (attempt glute sets and sit to stand with weight next visit)   Seated stability ball TEX: TA, march, pelvic tilts, pelvic clocks   // bar: step ups x10 , mini squats x10  Nautilus: side step walk out with push outs x10 each side, resisted rotation x10 each 30#  Discussed gym based/ HEP      PT Short Term Goals - 10/02/20 1506      PT SHORT TERM GOAL #1   Title Pt will be independent with HEP in order to improve strength and decrease back pain in order to improve pain-free function at home.    Time 4    Period Weeks    Status New    Target Date 10/30/20             PT Long Term Goals - 10/02/20 1506      PT LONG TERM GOAL #1   Title Pt will decrease mODI score by at least 13 percent in order demonstrate clinically significant reduction in back pain/disability.    Baseline 10/02/20: 46%    Time 8    Period Weeks    Status New    Target Date 11/27/20      PT LONG TERM GOAL #2   Title Pt will decrease worst back pain as reported on NPRS by at least 2 points in order to demonstrate clinically significant reduction in back pain.    Baseline 10/02/20: 8/10    Time 8    Period Weeks    Status New    Target Date 11/27/20      PT LONG TERM GOAL #3   Title Patient improved FOTO to at least 55 in order to demonstrate significant provement in function    Baseline 10/02/20: 35    Time 8    Period Weeks    Status New    Target Date 11/27/20                 Plan - 10/22/20 0815    Clinical Impression Statement Pt is demoing improvements in TA mm activation and endurance evident by good tolerance and form during progression of exercises. Pt required edu and verbal/visual cues for correct squat form today and was limited in squatting ability due to some discomfort in her knees.    Personal  Factors and Comorbidities Comorbidity 3+    Comorbidities CVA, lymphedema, OA, obesity    Examination-Activity Limitations Bed Mobility;Bend;Caring for Others;Squat;Stairs;Stand;Transfers    Examination-Participation Restrictions [employer]  Activity;Other   Traveling   Stability/Clinical Decision Making Evolving/Moderate complexity    Clinical Decision Making Moderate    Rehab Potential Good    PT Frequency 2x / week    PT Duration 8 weeks    PT Treatment/Interventions ADLs/Self Care Home Management;Aquatic Therapy;Biofeedback;Canalith Repostioning;Cryotherapy;Electrical Stimulation;Iontophoresis 4mg /ml Dexamethasone;Moist Heat;Traction;Ultrasound;DME Instruction;Gait training;Stair training;Functional mobility training;Therapeutic exercise;Therapeutic activities;Balance training;Neuromuscular re-education;Patient/family education;Manual techniques;Dry needling;Vestibular;Joint Manipulations;Spinal Manipulations    PT Next Visit Plan Continue to increase hip and core activation and endurance to ease functional squatting and stability of the lumbar spine. Send MD progress note next visit.    PT Home Exercise Plan None currently    Consulted and Agree with Plan of Care Patient           Patient will benefit from skilled therapeutic intervention in order to improve the following deficits and impairments:  Decreased strength,Pain  Visit Diagnosis: Chronic bilateral low back pain, unspecified whether sciatica present  Muscle weakness (generalized)  Chronic right-sided low back pain with right-sided sciatica     Problem List Patient Active Problem List   Diagnosis Date Noted  . Varicose veins 04/22/2020  . Chronic right-sided low back pain with right-sided sciatica 01/18/2020  . Foraminal stenosis of lumbar region 01/18/2020  . Lumbar disc herniation with radiculopathy 01/18/2020  . Lymphedema 10/16/2019  . Chronic venous insufficiency 10/16/2019  . DJD (degenerative joint disease)  10/16/2019  . Radicular leg pain 10/16/2019  . Ovarian fibroma, right 09/24/2017  . Post-operative state 09/24/2017  . Occlusion of left posterior cerebral artery 09/17/2017  . Occipital infarction ([place]) 09/04/2017  . Sudden visual loss, right eye 09/02/2017  . Cellulitis 08/28/2017  . Intra-abdominal and pelvic swelling, mass and lump, unspecified site 08/24/2017  . Bilateral foot pain 01/12/2017  . Burning sensation of feet 01/12/2017  . Hernia, hiatal 11/06/2016  . Morbid obesity with BMI of 40.0-44.9, adult ([place]) 07/01/2016  . Pain in toe of right foot 12/19/2015  . Trigger ring finger of left hand 11/28/2015  . Tear of meniscus of knee 07/03/2015  . Pain in left knee 06/04/2015  . Primary osteoarthritis of left knee 06/04/2015  . Ganglion cyst of foot 02/11/2015  . Hammer toe of right foot 02/11/2015  . Cardiac murmur 03/22/2014  . Vitamin D deficiency 05/16/2013   Kayleen Memos SPT Pura Spice, PT, DPT # [id number] 10/22/2020, 8:32 AM   [facility] [address] [place], [place], [address] Phone: [phone]   Fax:  [phone]  Name: Shailee Foots MRN: [id number] Date of Birth: 01-Jul-1966

## 2020-10-24 ENCOUNTER — Other Ambulatory Visit: Payer: Self-pay

## 2020-10-24 ENCOUNTER — Ambulatory Visit: Payer: BC Managed Care – PPO | Admitting: Physical Therapy

## 2020-10-24 ENCOUNTER — Encounter: Payer: Self-pay | Admitting: Physical Therapy

## 2020-10-24 DIAGNOSIS — M545 Low back pain, unspecified: Secondary | ICD-10-CM

## 2020-10-24 DIAGNOSIS — M6281 Muscle weakness (generalized): Secondary | ICD-10-CM

## 2020-10-24 DIAGNOSIS — G8929 Other chronic pain: Secondary | ICD-10-CM

## 2020-10-24 DIAGNOSIS — M5441 Lumbago with sciatica, right side: Secondary | ICD-10-CM

## 2020-10-24 NOTE — Therapy (Signed)
[facility] [address] [place], [place], [address] Phone: [phone]   Fax:  [phone]  Physical Therapy Treatment  Patient Details  Name: Shari Parks MRN: [id number] Date of Birth: [birth year] Referring Provider (PT): Dr. Alba Destine   Encounter Date: 10/24/2020   PT End of Session - 10/24/20 0819    Visit Number 5    Number of Visits 17    Date for PT Re-Evaluation 11/27/20    Authorization Type eval: 10/03/19    Authorization - Visit Number 5    Authorization - Number of Visits 10    PT Start Time 0730    PT Stop Time 0813    PT Time Calculation (min) 43 min    Activity Tolerance Patient tolerated treatment well    Behavior During Therapy [facility] for tasks assessed/performed           Past Medical History:  Diagnosis Date  . Arthritis    left knee  . Heart murmur    "slight" - followed by PCP  . History of hiatal hernia   . Hypercholesterolemia   . Lymphedema   . Stroke [facility])    Occlusion of left posterior cerebral artery  . Varicose vein of leg   . Wears contact lenses     Past Surgical History:  Procedure Laterality Date  . ABDOMINAL HYSTERECTOMY    . CHOLECYSTECTOMY  6/92  . COLONOSCOPY  04/30/14  . DIAGNOSTIC LAPAROSCOPY    . ESOPHAGOGASTRIC FUNDOPLASTY  2012  . ESOPHAGOGASTRODUODENOSCOPY (EGD) WITH PROPOFOL N/A 08/06/2020   Procedure: ESOPHAGOGASTRODUODENOSCOPY (EGD) WITH PROPOFOL;  Surgeon: Lesly Rubenstein, MD;  Location: [facility] ENDOSCOPY;  Service: Endoscopy;  Laterality: N/A;  . HAMMER TOE SURGERY Right 04/05/13  . KNEE ARTHROSCOPY Left 11/22/2015   Procedure: ARTHROSCOPY KNEE WITH CHONDROPLASTY MEDIAL FEMORAL CONDYLE;  Surgeon: Leanor Kail, MD;  Location: [place];  Service: Orthopedics;  Laterality: Left;  order of cases moved per CeCe and Dr. Leslye Peer  . LAPAROSCOPIC GASTRIC SLEEVE RESECTION  12/2010  . NOVASURE ABLATION  approx 2011  . TONSILLECTOMY  6/84    There were no vitals  filed for this visit.   Subjective Assessment - 10/24/20 0733    Subjective Pt states she's having 2/10 pain in the R LB this morning. Mornings are usually the best time and the pain gets worse as the day goes on.  Pt reports having an injection in the L knee yesterday and is having no problems with that. Pt reported having some sharp stabbing pain in her R LB inntermitently with standing and going from sitting to standing the last few days.    Pertinent History Patient is a very pleasant 55 y.o. who reports 1.5 years of low back pain. She did physical therapy last year for 1 month without relief. Her back pain has been worsening. She is s/p s/p right L4-5 TFESI 03/04/2020 with 50% relief initially but pain returned, right L4-5 TFESI 07/15/2020 with several days improvement, bilateral L5-S1 TFESI 08/16/2020 with left leg pain relief, but still with pain down the right leg, and right L4-5 TFESI 09/13/2020 with no relief. She reports pain in both sides of the lower back. No pain radiation down BLE at this time. She is reporting some weakness in her BLE and per patient she reports that MD noted more weakness in her RLE. She notices the weakness most notably when going up steps.. She denies any numbness, tingling or loss of control of bowel or  bladder. She has been released from physiatry with a referral to see neurosurgeon. MRI lumbar spine 10/2019: L2-3 mild CCS; L3-4 mild bilateral NFS; L4-5 mild to moderate CCS, mild left and severe right NFS due to right disc extrusion; moderate facet arthropathy L3-4 and L4-5. She has another MRI scheduled for next week to assess her back.    Limitations Standing;Walking    How long can you sit comfortably? 1 hour    How long can you stand comfortably? 15 minutes    How long can you walk comfortably? 15 minutes    Diagnostic tests See history    Patient Stated Goals Decrease back pain    Currently in Pain? Yes    Pain Score 2     Pain Location Back    Pain Orientation  Lower    Pain Onset More than a month ago             Assess lower quarter neuro screen next visit   There ex:  Assessed B HS flexibility and B hip flexion, IR, and ER ROM: all WNL   Modified bridge with bolster 2x10  TrA with SLR x10 each leg  TrA with ball overhead and SPT tactile cues 2x5  TrA with STS and 6# dumbbell    Seated stability ball TEX: march, pelvic tilts, pelvic clocks x1 min each   // bar: step ups (6 inch) x10 , mini squats 2x10  Nautilus: side step walk out with push outs x10 each side 40# (progressed from last visit), resisted rotation x10 each 30#       PT Short Term Goals - 10/02/20 1506      PT SHORT TERM GOAL #1   Title Pt will be independent with HEP in order to improve strength and decrease back pain in order to improve pain-free function at home.    Time 4    Period Weeks    Status New    Target Date 10/30/20             PT Long Term Goals - 10/02/20 1506      PT LONG TERM GOAL #1   Title Pt will decrease mODI score by at least 13 percent in order demonstrate clinically significant reduction in back pain/disability.    Baseline 10/02/20: 46%    Time 8    Period Weeks    Status New    Target Date 11/27/20      PT LONG TERM GOAL #2   Title Pt will decrease worst back pain as reported on NPRS by at least 2 points in order to demonstrate clinically significant reduction in back pain.    Baseline 10/02/20: 8/10    Time 8    Period Weeks    Status New    Target Date 11/27/20      PT LONG TERM GOAL #3   Title Patient improved FOTO to at least 55 in order to demonstrate significant provement in function    Baseline 10/02/20: 35    Time 8    Period Weeks    Status New    Target Date 11/27/20                 Plan - 10/24/20 0814    Clinical Impression Statement Progressed exercises today and pt did well with no c/o increased pain and good form, posture, and TrA contraction. Improvements in TrA function results in  increased stability of the lumbar spine. Pt reports increased feelings of weakness  in the R LE throughout exercises and pt requires B UE support with step ups and mini squats due to decreased LE strength and stability.    Personal Factors and Comorbidities Comorbidity 3+    Comorbidities CVA, lymphedema, OA, obesity    Examination-Activity Limitations Bed Mobility;Bend;Caring for Others;Squat;Stairs;Stand;Transfers    Examination-Participation Restrictions Community Activity;Other   Traveling   Stability/Clinical Decision Making Evolving/Moderate complexity    Clinical Decision Making Moderate    Rehab Potential Good    PT Frequency 2x / week    PT Duration 8 weeks    PT Treatment/Interventions ADLs/Self Care Home Management;Aquatic Therapy;Biofeedback;Canalith Repostioning;Cryotherapy;Electrical Stimulation;Iontophoresis 4mg /ml Dexamethasone;Moist Heat;Traction;Ultrasound;DME Instruction;Gait training;Stair training;Functional mobility training;Therapeutic exercise;Therapeutic activities;Balance training;Neuromuscular re-education;Patient/family education;Manual techniques;Dry needling;Vestibular;Joint Manipulations;Spinal Manipulations    PT Next Visit Plan Continue to increase hip and core activation and endurance to ease functional squatting and stability of the lumbar spine. Check reflexes and dermatomes and myotomes next visit.    PT Home Exercise Plan None currently    Consulted and Agree with Plan of Care Patient           Patient will benefit from skilled therapeutic intervention in order to improve the following deficits and impairments:  Decreased strength,Pain  Visit Diagnosis: Chronic bilateral low back pain, unspecified whether sciatica present  Muscle weakness (generalized)  Chronic right-sided low back pain with right-sided sciatica     Problem List Patient Active Problem List   Diagnosis Date Noted  . Varicose veins 04/22/2020  . Chronic right-sided low back pain  with right-sided sciatica 01/18/2020  . Foraminal stenosis of lumbar region 01/18/2020  . Lumbar disc herniation with radiculopathy 01/18/2020  . Lymphedema 10/16/2019  . Chronic venous insufficiency 10/16/2019  . DJD (degenerative joint disease) 10/16/2019  . Radicular leg pain 10/16/2019  . Ovarian fibroma, right 09/24/2017  . Post-operative state 09/24/2017  . Occlusion of left posterior cerebral artery 09/17/2017  . Occipital infarction ([place]) 09/04/2017  . Sudden visual loss, right eye 09/02/2017  . Cellulitis 08/28/2017  . Intra-abdominal and pelvic swelling, mass and lump, unspecified site 08/24/2017  . Bilateral foot pain 01/12/2017  . Burning sensation of feet 01/12/2017  . Hernia, hiatal 11/06/2016  . Morbid obesity with BMI of 40.0-44.9, adult ([place]) 07/01/2016  . Pain in toe of right foot 12/19/2015  . Trigger ring finger of left hand 11/28/2015  . Tear of meniscus of knee 07/03/2015  . Pain in left knee 06/04/2015  . Primary osteoarthritis of left knee 06/04/2015  . Ganglion cyst of foot 02/11/2015  . Hammer toe of right foot 02/11/2015  . Cardiac murmur 03/22/2014  . Vitamin D deficiency 05/16/2013   Pura Spice, PT, DPT # [id number] Kayleen Memos SPT 10/24/2020, 12:37 PM  [facility] [address] [place], [place], [address] Phone: [phone]   Fax:  [phone]  Name: Shari Parks MRN: [id number] Date of Birth: [birth year]

## 2020-10-29 ENCOUNTER — Encounter: Payer: Self-pay | Admitting: Physical Therapy

## 2020-10-31 ENCOUNTER — Encounter: Payer: Self-pay | Admitting: Physical Therapy

## 2020-11-05 ENCOUNTER — Ambulatory Visit: Payer: BC Managed Care – PPO | Attending: Physical Medicine & Rehabilitation | Admitting: Physical Therapy

## 2020-11-05 ENCOUNTER — Ambulatory Visit: Payer: BC Managed Care – PPO | Admitting: Physical Therapy

## 2020-11-05 ENCOUNTER — Encounter: Payer: Self-pay | Admitting: Physical Therapy

## 2020-11-05 ENCOUNTER — Other Ambulatory Visit: Payer: Self-pay

## 2020-11-05 DIAGNOSIS — G8929 Other chronic pain: Secondary | ICD-10-CM | POA: Diagnosis present

## 2020-11-05 DIAGNOSIS — M5441 Lumbago with sciatica, right side: Secondary | ICD-10-CM | POA: Diagnosis present

## 2020-11-05 DIAGNOSIS — M545 Low back pain, unspecified: Secondary | ICD-10-CM | POA: Insufficient documentation

## 2020-11-05 DIAGNOSIS — M6281 Muscle weakness (generalized): Secondary | ICD-10-CM | POA: Insufficient documentation

## 2020-11-05 NOTE — Therapy (Signed)
[facility] [address]. [place], [place], [address] Phone: [phone]   Fax:  [phone]  Physical Therapy Evaluation  Patient Details  Name: Shari Parks MRN: [id number] Date of Birth: November 10, 1965 Referring Provider (PT): Dr. Alba Destine   Encounter Date: 11/05/2020   PT End of Session - 11/05/20 0755    Visit Number 6    Number of Visits 17    Date for PT Re-Evaluation 11/27/20    Authorization Type eval: 10/03/19    Authorization - Visit Number 6    Authorization - Number of Visits 10    PT Start Time 0736    PT Stop Time 0810    PT Time Calculation (min) 34 min    Activity Tolerance Patient tolerated treatment well    Behavior During Therapy [facility] for tasks assessed/performed           Past Medical History:  Diagnosis Date  . Arthritis    left knee  . Heart murmur    "slight" - followed by PCP  . History of hiatal hernia   . Hypercholesterolemia   . Lymphedema   . Stroke [facility])    Occlusion of left posterior cerebral artery  . Varicose vein of leg   . Wears contact lenses     Past Surgical History:  Procedure Laterality Date  . ABDOMINAL HYSTERECTOMY    . CHOLECYSTECTOMY  6/92  . COLONOSCOPY  04/30/14  . DIAGNOSTIC LAPAROSCOPY    . ESOPHAGOGASTRIC FUNDOPLASTY  2012  . ESOPHAGOGASTRODUODENOSCOPY (EGD) WITH PROPOFOL N/A 08/06/2020   Procedure: ESOPHAGOGASTRODUODENOSCOPY (EGD) WITH PROPOFOL;  Surgeon: Lesly Rubenstein, MD;  Location: [facility];  Service: Endoscopy;  Laterality: N/A;  . HAMMER TOE SURGERY Right 04/05/13  . KNEE ARTHROSCOPY Left 11/22/2015   Procedure: ARTHROSCOPY KNEE WITH CHONDROPLASTY MEDIAL FEMORAL CONDYLE;  Surgeon: Leanor Kail, MD;  Location: [place];  Service: Orthopedics;  Laterality: Left;  order of cases moved per CeCe and Dr. Leslye Peer  . LAPAROSCOPIC GASTRIC SLEEVE RESECTION  12/2010  . NOVASURE ABLATION  approx 2011  . TONSILLECTOMY  6/84    There were no vitals  filed for this visit.   No bike and no squats today d/t shot in the knee yesterday.    Assess lower quarter neuro screen and repeated motions next visit.   There ex:  Supine TrA with march x20  Modified bridge with bolster 2x10  TrA with SLR 2x10 each leg  TrA with ball overhead and SPT tactile cues 2x5  Modified dead bug 2x10  Clamshells 2x10  Prone press up x10 Standing extension x10  Manual:  3x30 sec grade 2-4 SIJ mobs     Objective measurements completed on examination: See above findings.       PT Short Term Goals - 11/05/20 0920      PT SHORT TERM GOAL #1   Title Pt will be independent with HEP in order to improve strength and decrease back pain in order to improve pain-free function at home.    Time 4    Period Weeks    Status Achieved    Target Date 11/05/20             PT Long Term Goals - 10/02/20 1506      PT LONG TERM GOAL #1   Title Pt will decrease mODI score by at least 13 percent in order demonstrate clinically significant reduction in back pain/disability.    Baseline 10/02/20: 46%  Time 8    Period Weeks    Status New    Target Date 11/27/20      PT LONG TERM GOAL #2   Title Pt will decrease worst back pain as reported on NPRS by at least 2 points in order to demonstrate clinically significant reduction in back pain.    Baseline 10/02/20: 8/10    Time 8    Period Weeks    Status New    Target Date 11/27/20      PT LONG TERM GOAL #3   Title Patient improved FOTO to at least 55 in order to demonstrate significant provement in function    Baseline 10/02/20: 35    Time 8    Period Weeks    Status New    Target Date 11/27/20                  Plan - 11/05/20 0819    Clinical Impression Statement Decreased intensity of exercises today d/t recent shot in L knee. Will resume next visit. Added new dynamic TrA exercises today to further increase spinal stability and pt. Added SIJ mobs today to decrease SIJ pain and increase mobility.  Pt did not report a decrease or increase in pain post mobs. Trialed extension based exercises today to decrease pain and pt states neither increased or decreased pain in the moment.    Personal Factors and Comorbidities Comorbidity 3+    Comorbidities CVA, lymphedema, OA, obesity    Examination-Activity Limitations Bed Mobility;Bend;Caring for Others;Squat;Stairs;Stand;Transfers    Examination-Participation Restrictions Community Activity;Other   Traveling   Stability/Clinical Decision Making Evolving/Moderate complexity    Clinical Decision Making Moderate    Rehab Potential Good    PT Frequency 2x / week    PT Duration 8 weeks    PT Treatment/Interventions ADLs/Self Care Home Management;Aquatic Therapy;Biofeedback;Canalith Repostioning;Cryotherapy;Electrical Stimulation;Iontophoresis 4mg /ml Dexamethasone;Moist Heat;Traction;Ultrasound;DME Instruction;Gait training;Stair training;Functional mobility training;Therapeutic exercise;Therapeutic activities;Balance training;Neuromuscular re-education;Patient/family education;Manual techniques;Dry needling;Vestibular;Joint Manipulations;Spinal Manipulations    PT Next Visit Plan Check reflexes and dermatomes and myotomes next visit, and resume LE strengthening exercises.    PT Home Exercise Plan None currently    Consulted and Agree with Plan of Care Patient           Patient will benefit from skilled therapeutic intervention in order to improve the following deficits and impairments:  Decreased strength,Pain  Visit Diagnosis: Chronic bilateral low back pain, unspecified whether sciatica present  Muscle weakness (generalized)  Chronic right-sided low back pain with right-sided sciatica     Problem List Patient Active Problem List   Diagnosis Date Noted  . Varicose veins 04/22/2020  . Chronic right-sided low back pain with right-sided sciatica 01/18/2020  . Foraminal stenosis of lumbar region 01/18/2020  . Lumbar disc herniation with  radiculopathy 01/18/2020  . Lymphedema 10/16/2019  . Chronic venous insufficiency 10/16/2019  . DJD (degenerative joint disease) 10/16/2019  . Radicular leg pain 10/16/2019  . Ovarian fibroma, right 09/24/2017  . Post-operative state 09/24/2017  . Occlusion of left posterior cerebral artery 09/17/2017  . Occipital infarction ([place]) 09/04/2017  . Sudden visual loss, right eye 09/02/2017  . Cellulitis 08/28/2017  . Intra-abdominal and pelvic swelling, mass and lump, unspecified site 08/24/2017  . Bilateral foot pain 01/12/2017  . Burning sensation of feet 01/12/2017  . Hernia, hiatal 11/06/2016  . Morbid obesity with BMI of 40.0-44.9, adult ([place]) 07/01/2016  . Pain in toe of right foot 12/19/2015  . Trigger ring finger of left hand 11/28/2015  .  Tear of meniscus of knee 07/03/2015  . Pain in left knee 06/04/2015  . Primary osteoarthritis of left knee 06/04/2015  . Ganglion cyst of foot 02/11/2015  . Hammer toe of right foot 02/11/2015  . Cardiac murmur 03/22/2014  . Vitamin D deficiency 05/16/2013    Pura Spice, PT, DPT # [id number] Kayleen Memos SPT 11/05/2020, 9:21 AM  [facility] [address] [place], [place], [address] Phone: [phone]   Fax:  [phone]  Name: Shari Parks MRN: [id number] Date of Birth: 1966-03-22

## 2020-11-07 ENCOUNTER — Encounter: Payer: Self-pay | Admitting: Physical Therapy

## 2020-11-07 ENCOUNTER — Ambulatory Visit: Payer: BC Managed Care – PPO | Admitting: Physical Therapy

## 2020-11-07 ENCOUNTER — Other Ambulatory Visit: Payer: Self-pay

## 2020-11-07 DIAGNOSIS — M545 Low back pain, unspecified: Secondary | ICD-10-CM

## 2020-11-07 DIAGNOSIS — G8929 Other chronic pain: Secondary | ICD-10-CM

## 2020-11-07 DIAGNOSIS — M6281 Muscle weakness (generalized): Secondary | ICD-10-CM

## 2020-11-07 DIAGNOSIS — M5441 Lumbago with sciatica, right side: Secondary | ICD-10-CM

## 2020-11-07 NOTE — Therapy (Signed)
[facility] [address] [place], [place], [address] Phone: [phone]   Fax:  [phone]  Physical Therapy Treatment  Patient Details  Name: Shari Parks MRN: [id number] Date of Birth: 08/19/1966 Referring Provider (PT): Dr. Alba Destine   Encounter Date: 11/07/2020   PT End of Session - 11/07/20 0823    Visit Number 7    Number of Visits 17    Date for PT Re-Evaluation 11/27/20    Authorization Type eval: 10/03/19    Authorization - Visit Number 7    Authorization - Number of Visits 10    PT Start Time 0730    PT Stop Time 0819    PT Time Calculation (min) 49 min    Activity Tolerance Patient tolerated treatment well    Behavior During Therapy [facility] for tasks assessed/performed           Past Medical History:  Diagnosis Date  . Arthritis    left knee  . Heart murmur    "slight" - followed by PCP  . History of hiatal hernia   . Hypercholesterolemia   . Lymphedema   . Stroke [facility])    Occlusion of left posterior cerebral artery  . Varicose vein of leg   . Wears contact lenses     Past Surgical History:  Procedure Laterality Date  . ABDOMINAL HYSTERECTOMY    . CHOLECYSTECTOMY  6/92  . COLONOSCOPY  04/30/14  . DIAGNOSTIC LAPAROSCOPY    . ESOPHAGOGASTRIC FUNDOPLASTY  2012  . ESOPHAGOGASTRODUODENOSCOPY (EGD) WITH PROPOFOL N/A 08/06/2020   Procedure: ESOPHAGOGASTRODUODENOSCOPY (EGD) WITH PROPOFOL;  Surgeon: Lesly Rubenstein, MD;  Location: [facility];  Service: Endoscopy;  Laterality: N/A;  . HAMMER TOE SURGERY Right 04/05/13  . KNEE ARTHROSCOPY Left 11/22/2015   Procedure: ARTHROSCOPY KNEE WITH CHONDROPLASTY MEDIAL FEMORAL CONDYLE;  Surgeon: Leanor Kail, MD;  Location[place];  Service: Orthopedics;  Laterality: Left;  order of cases moved per CeCe and Dr. Leslye Peer  . LAPAROSCOPIC GASTRIC SLEEVE RESECTION  12/2010  . NOVASURE ABLATION  approx 2011  . TONSILLECTOMY  6/84    There were no vitals  filed for this visit.   Subjective Assessment - 11/07/20 0825    Subjective Pt states she has been having increased pain in the R side of her LB the last two days. This morning when she woke up it was at a 7/10, but right now it's at a 4/10. Pt states she was standing in the kitchen yesterday and had to sit down after about an hour because of the pain increased. Pt states her knee is feeling good.    Pertinent History Patient is a very pleasant 55 y.o. who reports 1.5 years of low back pain. She did physical therapy last year for 1 month without relief. Her back pain has been worsening. She is s/p s/p right L4-5 TFESI 03/04/2020 with 50% relief initially but pain returned, right L4-5 TFESI 07/15/2020 with several days improvement, bilateral L5-S1 TFESI 08/16/2020 with left leg pain relief, but still with pain down the right leg, and right L4-5 TFESI 09/13/2020 with no relief. She reports pain in both sides of the lower back. No pain radiation down BLE at this time. She is reporting some weakness in her BLE and per patient she reports that MD noted more weakness in her RLE. She notices the weakness most notably when going up steps.. She denies any numbness, tingling or loss of control of bowel or bladder. She has  been released from physiatry with a referral to see neurosurgeon. MRI lumbar spine 10/2019: L2-3 mild CCS; L3-4 mild bilateral NFS; L4-5 mild to moderate CCS, mild left and severe right NFS due to right disc extrusion; moderate facet arthropathy L3-4 and L4-5. She has another MRI scheduled for next week to assess her back.    Limitations Standing;Walking    How long can you sit comfortably? 1 hour    How long can you stand comfortably? 15 minutes    How long can you walk comfortably? 15 minutes    Diagnostic tests See history    Patient Stated Goals Decrease back pain    Currently in Pain? Yes    Pain Score 4     Pain Location Back    Pain Orientation Right;Lower    Pain Onset More than a month  ago             Nustep x10 min seat 10, level 4 (obtained subjective in first 5 min)    There ex:   Modified bridge with bolster 2x10  TrA with SLR 2x10 each leg  Modified dead bug 2x10  Clamshells 2x10  Prone press up x10  Nautilus:   Side step with resisted press out x10 bilat. and 30#  Mini squats in // bars with resistance at pelvis ( 2 black bands) 2x10    Manual:  3x30 sec grade 2-3 SIJ mobs Lumbar CPA grade 1-3 3x30 sec  Lumbar R UPA grade 1-3 3x30 sec      PT Short Term Goals - 11/05/20 0920      PT SHORT TERM GOAL #1   Title Pt will be independent with HEP in order to improve strength and decrease back pain in order to improve pain-free function at home.    Time 4    Period Weeks    Status Achieved    Target Date 11/05/20             PT Long Term Goals - 10/02/20 1506      PT LONG TERM GOAL #1   Title Pt will decrease mODI score by at least 13 percent in order demonstrate clinically significant reduction in back pain/disability.    Baseline 10/02/20: 46%    Time 8    Period Weeks    Status New    Target Date 11/27/20      PT LONG TERM GOAL #2   Title Pt will decrease worst back pain as reported on NPRS by at least 2 points in order to demonstrate clinically significant reduction in back pain.    Baseline 10/02/20: 8/10    Time 8    Period Weeks    Status New    Target Date 11/27/20      PT LONG TERM GOAL #3   Title Patient improved FOTO to at least 55 in order to demonstrate significant provement in function    Baseline 10/02/20: 35    Time 8    Period Weeks    Status New    Target Date 11/27/20              Plan - 11/07/20 0829    Clinical Impression Statement Manual done today to decrease pain and improve mobility in SI joint and lumbar spine and pt tolerated well with no c/o increased pain. Exercises completed today to increase core and hip strength and NM control to increase stability of the pelvis and spine to ease prolonged  standing while working in the  kitchen. Pt reported increased pain in the R SIJ area with modified bridges, but no increased pain and good form with all other exercises.    Personal Factors and Comorbidities Comorbidity 3+    Comorbidities CVA, lymphedema, OA, obesity    Examination-Activity Limitations Bed Mobility;Bend;Caring for Others;Squat;Stairs;Stand;Transfers    Examination-Participation Restrictions Community Activity;Other   Traveling   Stability/Clinical Decision Making Evolving/Moderate complexity    Clinical Decision Making Moderate    Rehab Potential Good    PT Frequency 2x / week    PT Duration 8 weeks    PT Treatment/Interventions ADLs/Self Care Home Management;Aquatic Therapy;Biofeedback;Canalith Repostioning;Cryotherapy;Electrical Stimulation;Iontophoresis 4mg /ml Dexamethasone;Moist Heat;Traction;Ultrasound;DME Instruction;Gait training;Stair training;Functional mobility training;Therapeutic exercise;Therapeutic activities;Balance training;Neuromuscular re-education;Patient/family education;Manual techniques;Dry needling;Vestibular;Joint Manipulations;Spinal Manipulations    PT Next Visit Plan Continue to increase lumbar spine and pelvic stability to ease prolonged standing.    PT Home Exercise Plan None currently    Consulted and Agree with Plan of Care Patient           Patient will benefit from skilled therapeutic intervention in order to improve the following deficits and impairments:  Decreased strength,Pain  Visit Diagnosis: Chronic bilateral low back pain, unspecified whether sciatica present  Muscle weakness (generalized)  Chronic right-sided low back pain with right-sided sciatica     Problem List Patient Active Problem List   Diagnosis Date Noted  . Varicose veins 04/22/2020  . Chronic right-sided low back pain with right-sided sciatica 01/18/2020  . Foraminal stenosis of lumbar region 01/18/2020  . Lumbar disc herniation with radiculopathy 01/18/2020   . Lymphedema 10/16/2019  . Chronic venous insufficiency 10/16/2019  . DJD (degenerative joint disease) 10/16/2019  . Radicular leg pain 10/16/2019  . Ovarian fibroma, right 09/24/2017  . Post-operative state 09/24/2017  . Occlusion of left posterior cerebral artery 09/17/2017  . Occipital infarction ([place]) 09/04/2017  . Sudden visual loss, right eye 09/02/2017  . Cellulitis 08/28/2017  . Intra-abdominal and pelvic swelling, mass and lump, unspecified site 08/24/2017  . Bilateral foot pain 01/12/2017  . Burning sensation of feet 01/12/2017  . Hernia, hiatal 11/06/2016  . Morbid obesity with BMI of 40.0-44.9, adult ([place]) 07/01/2016  . Pain in toe of right foot 12/19/2015  . Trigger ring finger of left hand 11/28/2015  . Tear of meniscus of knee 07/03/2015  . Pain in left knee 06/04/2015  . Primary osteoarthritis of left knee 06/04/2015  . Ganglion cyst of foot 02/11/2015  . Hammer toe of right foot 02/11/2015  . Cardiac murmur 03/22/2014  . Vitamin D deficiency 05/16/2013   Pura Spice, PT, DPT # [id number] Kayleen Memos, SPT 11/07/2020, 10:01 AM  [place] [facility] [address] [place], [place], [address] Phone: [phone]   Fax:  [phone]  Name: Shari Parks MRN: [id number] Date of Birth: [birth year]

## 2020-11-12 ENCOUNTER — Other Ambulatory Visit: Payer: Self-pay

## 2020-11-12 ENCOUNTER — Ambulatory Visit: Payer: BC Managed Care – PPO | Admitting: Physical Therapy

## 2020-11-12 ENCOUNTER — Encounter: Payer: Self-pay | Admitting: Physical Therapy

## 2020-11-12 DIAGNOSIS — M545 Low back pain, unspecified: Secondary | ICD-10-CM | POA: Diagnosis not present

## 2020-11-12 DIAGNOSIS — G8929 Other chronic pain: Secondary | ICD-10-CM

## 2020-11-12 DIAGNOSIS — M6281 Muscle weakness (generalized): Secondary | ICD-10-CM

## 2020-11-12 NOTE — Therapy (Signed)
[facility] [address] [place], [place], [address] Phone: [phone]   Fax:  [phone]  Physical Therapy Treatment  Patient Details  Name: Lenell Mcconnell MRN: [id number] Date of Birth: 1965/10/03 Referring Provider (PT): Dr. Alba Destine   Encounter Date: 11/12/2020  Treatment: 8 of 17.  Recert date:  06/02/6386 0730 to 0805   Past Medical History:  Diagnosis Date  . Arthritis    left knee  . Heart murmur    "slight" - followed by PCP  . History of hiatal hernia   . Hypercholesterolemia   . Lymphedema   . Stroke [facility])    Occlusion of left posterior cerebral artery  . Varicose vein of leg   . Wears contact lenses     Past Surgical History:  Procedure Laterality Date  . ABDOMINAL HYSTERECTOMY    . CHOLECYSTECTOMY  6/92  . COLONOSCOPY  04/30/14  . DIAGNOSTIC LAPAROSCOPY    . ESOPHAGOGASTRIC FUNDOPLASTY  2012  . ESOPHAGOGASTRODUODENOSCOPY (EGD) WITH PROPOFOL N/A 08/06/2020   Procedure: ESOPHAGOGASTRODUODENOSCOPY (EGD) WITH PROPOFOL;  Surgeon: Lesly Rubenstein, MD;  Location: [facility];  Service: Endoscopy;  Laterality: N/A;  . HAMMER TOE SURGERY Right 04/05/13  . KNEE ARTHROSCOPY Left 11/22/2015   Procedure: ARTHROSCOPY KNEE WITH CHONDROPLASTY MEDIAL FEMORAL CONDYLE;  Surgeon: Leanor Kail, MD;  Location: [place];  Service: Orthopedics;  Laterality: Left;  order of cases moved per CeCe and Dr. Leslye Peer  . LAPAROSCOPIC GASTRIC SLEEVE RESECTION  12/2010  . NOVASURE ABLATION  approx 2011  . TONSILLECTOMY  6/84    There were no vitals filed for this visit.   Pt states she got another shot in her L knee yesterday and is having no pain in her L knee today. Her LB is also feeling better today and she has been sleeping better since the doctor changed some of her meds. Pt reports a 1/10 in her R LB today. Pt states she did not have any increased pain from added manual last visit      No nustep or standing  exercises done today d/t recent shot in L knee.    There ex:   Prone hip ext (added today) 2x10  bridge 2x10 (progressed from modified today)  TrA with SLR 2x15 each leg (progressed reps today) Modified dead bug with weighted ball 3x10 (progressed today) Clamshells 2x15 (progressed reps today)  Prone press up x10 Nautilus: 20# chops and reverse chops x10 each (added today)   Next visit:  SL hip abd    Manual:  3x30 sec grade 2-3 SIJ mobs Lumbar CPA grade 1-3 3x30 sec  Lumbar R UPA grade 1-3 3x30 sec    PT Short Term Goals - 11/05/20 0920      PT SHORT TERM GOAL #1   Title Pt will be independent with HEP in order to improve strength and decrease back pain in order to improve pain-free function at home.    Time 4    Period Weeks    Status Achieved    Target Date 11/05/20             PT Long Term Goals - 10/02/20 1506      PT LONG TERM GOAL #1   Title Pt will decrease mODI score by at least 13 percent in order demonstrate clinically significant reduction in back pain/disability.    Baseline 10/02/20: 46%    Time 8    Period Weeks    Status New  Target Date 11/27/20      PT LONG TERM GOAL #2   Title Pt will decrease worst back pain as reported on NPRS by at least 2 points in order to demonstrate clinically significant reduction in back pain.    Baseline 10/02/20: 8/10    Time 8    Period Weeks    Status New    Target Date 11/27/20      PT LONG TERM GOAL #3   Title Patient improved FOTO to at least 55 in order to demonstrate significant provement in function    Baseline 10/02/20: 35    Time 8    Period Weeks    Status New    Target Date 11/27/20             No nustep or standing exercises done today d/t recent shot in L knee. Manual completed today to decrease pain and increase mobility and exercises progressed to further increase core and hip strength and stability and pt tolerated well with no c/o increased pain. Improvements in pain and lumbar and pelvic  stability and mobility has resulted in an increase in amb tolerance from 15 to 30 minutes.      Patient will benefit from skilled therapeutic intervention in order to improve the following deficits and impairments:  Decreased strength,Pain  Visit Diagnosis: Chronic bilateral low back pain, unspecified whether sciatica present  Muscle weakness (generalized)  Chronic right-sided low back pain with right-sided sciatica     Problem List Patient Active Problem List   Diagnosis Date Noted  . Varicose veins 04/22/2020  . Chronic right-sided low back pain with right-sided sciatica 01/18/2020  . Foraminal stenosis of lumbar region 01/18/2020  . Lumbar disc herniation with radiculopathy 01/18/2020  . Lymphedema 10/16/2019  . Chronic venous insufficiency 10/16/2019  . DJD (degenerative joint disease) 10/16/2019  . Radicular leg pain 10/16/2019  . Ovarian fibroma, right 09/24/2017  . Post-operative state 09/24/2017  . Occlusion of left posterior cerebral artery 09/17/2017  . Occipital infarction ([place]) 09/04/2017  . Sudden visual loss, right eye 09/02/2017  . Cellulitis 08/28/2017  . Intra-abdominal and pelvic swelling, mass and lump, unspecified site 08/24/2017  . Bilateral foot pain 01/12/2017  . Burning sensation of feet 01/12/2017  . Hernia, hiatal 11/06/2016  . Morbid obesity with BMI of 40.0-44.9, adult ([place]) 07/01/2016  . Pain in toe of right foot 12/19/2015  . Trigger ring finger of left hand 11/28/2015  . Tear of meniscus of knee 07/03/2015  . Pain in left knee 06/04/2015  . Primary osteoarthritis of left knee 06/04/2015  . Ganglion cyst of foot 02/11/2015  . Hammer toe of right foot 02/11/2015  . Cardiac murmur 03/22/2014  . Vitamin D deficiency 05/16/2013    Pura Spice, PT, DPT # [id number] Kayleen Memos SPT 11/13/2020, 4:30 PM  [place] [facility] [address] [place], [place], [address] Phone: [phone]   Fax:   [phone]  Name: Lanyiah Brix MRN: [id number] Date of Birth: Jan 25, 1966

## 2020-11-14 ENCOUNTER — Other Ambulatory Visit: Payer: Self-pay

## 2020-11-14 ENCOUNTER — Ambulatory Visit: Payer: BC Managed Care – PPO | Admitting: Physical Therapy

## 2020-11-14 ENCOUNTER — Encounter: Payer: Self-pay | Admitting: Physical Therapy

## 2020-11-14 DIAGNOSIS — M6281 Muscle weakness (generalized): Secondary | ICD-10-CM

## 2020-11-14 DIAGNOSIS — G8929 Other chronic pain: Secondary | ICD-10-CM

## 2020-11-14 DIAGNOSIS — M545 Low back pain, unspecified: Secondary | ICD-10-CM | POA: Diagnosis not present

## 2020-11-14 NOTE — Patient Instructions (Signed)
Access Code: [email or web address]: 02/17/2022Prepared by: Legrand Como SherkExercises  Supine Lower Trunk Rotation - 1 x daily - 7 x weekly - 10 reps  Supine Bridge - 4 x weekly - 2 sets - 10 reps  Supine Active Straight Leg Raise - 4 x weekly - 2 sets - 15 reps  Dead Bug - 4 x weekly - 3 sets - 10 reps  Clamshell - 4 x weekly - 2 sets - 10 reps

## 2020-11-14 NOTE — Therapy (Signed)
[facility] [address] [place], [place], [address] Phone: [phone]   Fax:  [phone]  Physical Therapy Treatment  Patient Details  Name: Shari Parks MRN: [id number] Date of Birth: May 18, 1966 Referring Provider (PT): Dr. Alba Destine   Encounter Date: 11/14/2020   PT End of Session - 11/14/20 0825    Visit Number 9    Number of Visits 17    Date for PT Re-Evaluation 11/27/20    Authorization Type eval: 10/03/19    Authorization - Visit Number 9    Authorization - Number of Visits 10    PT Start Time 0730    PT Stop Time 0814    PT Time Calculation (min) 44 min    Activity Tolerance Patient tolerated treatment well    Behavior During Therapy [facility] for tasks assessed/performed           Past Medical History:  Diagnosis Date  . Arthritis    left knee  . Heart murmur    "slight" - followed by PCP  . History of hiatal hernia   . Hypercholesterolemia   . Lymphedema   . Stroke [facility])    Occlusion of left posterior cerebral artery  . Varicose vein of leg   . Wears contact lenses     Past Surgical History:  Procedure Laterality Date  . ABDOMINAL HYSTERECTOMY    . CHOLECYSTECTOMY  6/92  . COLONOSCOPY  04/30/14  . DIAGNOSTIC LAPAROSCOPY    . ESOPHAGOGASTRIC FUNDOPLASTY  2012  . ESOPHAGOGASTRODUODENOSCOPY (EGD) WITH PROPOFOL N/A 08/06/2020   Procedure: ESOPHAGOGASTRODUODENOSCOPY (EGD) WITH PROPOFOL;  Surgeon: Lesly Rubenstein, MD;  Location: [facility] ENDOSCOPY;  Service: Endoscopy;  Laterality: N/A;  . HAMMER TOE SURGERY Right 04/05/13  . KNEE ARTHROSCOPY Left 11/22/2015   Procedure: ARTHROSCOPY KNEE WITH CHONDROPLASTY MEDIAL FEMORAL CONDYLE;  Surgeon: Leanor Kail, MD;  Location: [facility];  Service: Orthopedics;  Laterality: Left;  order of cases moved per CeCe and Dr. Leslye Peer  . LAPAROSCOPIC GASTRIC SLEEVE RESECTION  12/2010  . NOVASURE ABLATION  approx 2011  . TONSILLECTOMY  6/84    There were no vitals  filed for this visit.   Subjective Assessment - 11/14/20 0830    Subjective Pt states her L knee is feeling good today after getting an injection in. Pt states she is having 3/10 pain in the R side of her LB today. She gets increased pain when she is lifting her R leg up to get into her car.    Pertinent History Patient is a very pleasant 55 y.o. who reports 1.5 years of low back pain. She did physical therapy last year for 1 month without relief. Her back pain has been worsening. She is s/p s/p right L4-5 TFESI 03/04/2020 with 50% relief initially but pain returned, right L4-5 TFESI 07/15/2020 with several days improvement, bilateral L5-S1 TFESI 08/16/2020 with left leg pain relief, but still with pain down the right leg, and right L4-5 TFESI 09/13/2020 with no relief. She reports pain in both sides of the lower back. No pain radiation down BLE at this time. She is reporting some weakness in her BLE and per patient she reports that MD noted more weakness in her RLE. She notices the weakness most notably when going up steps.. She denies any numbness, tingling or loss of control of bowel or bladder. She has been released from physiatry with a referral to see neurosurgeon. MRI lumbar spine 10/2019: L2-3 mild CCS; L3-4 mild bilateral NFS;  L4-5 mild to moderate CCS, mild left and severe right NFS due to right disc extrusion; moderate facet arthropathy L3-4 and L4-5. She has another MRI scheduled for next week to assess her back.    Limitations Standing;Walking    How long can you sit comfortably? 1 hour    How long can you stand comfortably? 15 minutes    How long can you walk comfortably? 30 minutes    Diagnostic tests See history    Patient Stated Goals Decrease back pain    Currently in Pain? Yes    Pain Score 3     Pain Location Back    Pain Orientation Right;Lower    Pain Descriptors / Indicators Aching    Pain Onset More than a month ago          There ex:    bridge 2x10  TrA with SLR 2x15  each leg (progressed reps today) Modified dead bug with weighted ball 3x10 SL hip abd x 10 bilat. (added today) Resisted side stepping, double black band 5 laps each Squats with pelvic resistance, double black band 2x10    Manual:  3x30 sec grade 2-3 SIJ mobs Lumbar CPA grade 1-3 3x30 sec  Lumbar R UPA grade 1-3 3x30 sec Manual LTR x10  R LAD hip x30 sec   Bridges done pre manual 4/10 pain, bridges done after manual 2/10 pain reported.    MMT:  R hip abd 3+/5  L hip abd 4-/5   Updated HEP today:  LTR Bridge SLR Clam Dead bug        PT Education - 11/14/20 0827    Education Details updated HEP (LTR,  Bridge,  SLR,  Clam,  Dead bug)    Person(s) Educated Patient    Methods Explanation;Demonstration;Handout    Comprehension Verbalized understanding;Returned demonstration            PT Short Term Goals - 11/05/20 0920      PT SHORT TERM GOAL #1   Title Pt will be independent with HEP in order to improve strength and decrease back pain in order to improve pain-free function at home.    Time 4    Period Weeks    Status Achieved    Target Date 11/05/20             PT Long Term Goals - 10/02/20 1506      PT LONG TERM GOAL #1   Title Pt will decrease mODI score by at least 13 percent in order demonstrate clinically significant reduction in back pain/disability.    Baseline 10/02/20: 46%    Time 8    Period Weeks    Status New    Target Date 11/27/20      PT LONG TERM GOAL #2   Title Pt will decrease worst back pain as reported on NPRS by at least 2 points in order to demonstrate clinically significant reduction in back pain.    Baseline 10/02/20: 8/10    Time 8    Period Weeks    Status New    Target Date 11/27/20      PT LONG TERM GOAL #3   Title Patient improved FOTO to at least 55 in order to demonstrate significant provement in function    Baseline 10/02/20: 35    Time 8    Period Weeks    Status New    Target Date 11/27/20  Plan - 11/14/20 0825    Clinical Impression Statement Pt presents with deficits in B hip abd strength (R hip abd 3+/5, L hip abd 4-/5) which can result in decreased pelvic and spinal stability with prolonged standing, walking, and getting in and out of the car. Pain monitored before and after manual and exercises and pt reported a decrease in pain from 4/10 to 2/10.    Personal Factors and Comorbidities Comorbidity 3+    Comorbidities CVA, lymphedema, OA, obesity    Examination-Activity Limitations Bed Mobility;Bend;Caring for Others;Squat;Stairs;Stand;Transfers    Examination-Participation Restrictions Community Activity;Other   Traveling   Stability/Clinical Decision Making Evolving/Moderate complexity    Clinical Decision Making Moderate    Rehab Potential Good    PT Frequency 2x / week    PT Duration 8 weeks    PT Treatment/Interventions ADLs/Self Care Home Management;Aquatic Therapy;Biofeedback;Canalith Repostioning;Cryotherapy;Electrical Stimulation;Iontophoresis 4mg /ml Dexamethasone;Moist Heat;Traction;Ultrasound;DME Instruction;Gait training;Stair training;Functional mobility training;Therapeutic exercise;Therapeutic activities;Balance training;Neuromuscular re-education;Patient/family education;Manual techniques;Dry needling;Vestibular;Joint Manipulations;Spinal Manipulations    PT Next Visit Plan Assess pt tolerance to HEP and continue to increase B hip abd strength.    PT Home Exercise Plan None currently    Consulted and Agree with Plan of Care Patient           Patient will benefit from skilled therapeutic intervention in order to improve the following deficits and impairments:  Decreased strength,Pain  Visit Diagnosis: Chronic bilateral low back pain, unspecified whether sciatica present  Muscle weakness (generalized)     Problem List Patient Active Problem List   Diagnosis Date Noted  . Varicose veins 04/22/2020  . Chronic right-sided low back pain  with right-sided sciatica 01/18/2020  . Foraminal stenosis of lumbar region 01/18/2020  . Lumbar disc herniation with radiculopathy 01/18/2020  . Lymphedema 10/16/2019  . Chronic venous insufficiency 10/16/2019  . DJD (degenerative joint disease) 10/16/2019  . Radicular leg pain 10/16/2019  . Ovarian fibroma, right 09/24/2017  . Post-operative state 09/24/2017  . Occlusion of left posterior cerebral artery 09/17/2017  . Occipital infarction ([place]) 09/04/2017  . Sudden visual loss, right eye 09/02/2017  . Cellulitis 08/28/2017  . Intra-abdominal and pelvic swelling, mass and lump, unspecified site 08/24/2017  . Bilateral foot pain 01/12/2017  . Burning sensation of feet 01/12/2017  . Hernia, hiatal 11/06/2016  . Morbid obesity with BMI of 40.0-44.9, adult ([place]) 07/01/2016  . Pain in toe of right foot 12/19/2015  . Trigger ring finger of left hand 11/28/2015  . Tear of meniscus of knee 07/03/2015  . Pain in left knee 06/04/2015  . Primary osteoarthritis of left knee 06/04/2015  . Ganglion cyst of foot 02/11/2015  . Hammer toe of right foot 02/11/2015  . Cardiac murmur 03/22/2014  . Vitamin D deficiency 05/16/2013    Pura Spice, PT, DPT # [phone] Kayleen Memos SPT  11/14/2020, 10:12 AM  [facility] [address] [place], [place], [address] Phone: [phone]   Fax:  [phone]  Name: Shari Parks MRN: [id number] Date of Birth: 1966-04-27

## 2020-11-19 ENCOUNTER — Ambulatory Visit: Payer: BC Managed Care – PPO | Admitting: Physical Therapy

## 2020-11-19 ENCOUNTER — Other Ambulatory Visit: Payer: Self-pay

## 2020-11-19 ENCOUNTER — Encounter: Payer: Self-pay | Admitting: Physical Therapy

## 2020-11-19 DIAGNOSIS — M6281 Muscle weakness (generalized): Secondary | ICD-10-CM

## 2020-11-19 DIAGNOSIS — M545 Low back pain, unspecified: Secondary | ICD-10-CM | POA: Diagnosis not present

## 2020-11-19 NOTE — Therapy (Addendum)
[facility] [address] [place], [place], [address] Phone: [phone]   Fax:  [phone]  Physical Therapy Treatment Physical Therapy Progress Note   Dates of reporting period  10/02/20  to   11/19/20   Patient Details  Name: Shari Parks MRN: [id number] Date of Birth: March 23, 1966 Referring Provider (PT): Dr. Alba Destine   Encounter Date: 11/19/2020   PT End of Session - 11/19/20 0736    Visit Number 10    Number of Visits 17    Date for PT Re-Evaluation 11/27/20    Authorization Type eval: 10/03/19    Authorization - Visit Number 10    Authorization - Number of Visits 10    PT Start Time 0736    PT Stop Time 0811    PT Time Calculation (min) 35 min    Activity Tolerance Patient tolerated treatment well    Behavior During Therapy [facility] for tasks assessed/performed           Past Medical History:  Diagnosis Date  . Arthritis    left knee  . Heart murmur    "slight" - followed by PCP  . History of hiatal hernia   . Hypercholesterolemia   . Lymphedema   . Stroke [facility])    Occlusion of left posterior cerebral artery  . Varicose vein of leg   . Wears contact lenses     Past Surgical History:  Procedure Laterality Date  . ABDOMINAL HYSTERECTOMY    . CHOLECYSTECTOMY  6/92  . COLONOSCOPY  04/30/14  . DIAGNOSTIC LAPAROSCOPY    . ESOPHAGOGASTRIC FUNDOPLASTY  2012  . ESOPHAGOGASTRODUODENOSCOPY (EGD) WITH PROPOFOL N/A 08/06/2020   Procedure: ESOPHAGOGASTRODUODENOSCOPY (EGD) WITH PROPOFOL;  Surgeon: Lesly Rubenstein, MD;  Location: [facility] ENDOSCOPY;  Service: Endoscopy;  Laterality: N/A;  . HAMMER TOE SURGERY Right 04/05/13  . KNEE ARTHROSCOPY Left 11/22/2015   Procedure: ARTHROSCOPY KNEE WITH CHONDROPLASTY MEDIAL FEMORAL CONDYLE;  Surgeon: Leanor Kail, MD;  Location: [place];  Service: Orthopedics;  Laterality: Left;  order of cases moved per CeCe and Dr. Leslye Peer  . LAPAROSCOPIC GASTRIC SLEEVE RESECTION  12/2010   . NOVASURE ABLATION  approx 2011  . TONSILLECTOMY  6/84    There were no vitals filed for this visit.    Subjective Assessment - 11/19/20 0815    Subjective Pt reports a pain score of 1/10 in her R LB today and is having a really good day and has been feeling good the last few days. She is getting one more shot in her L knee today.    Pertinent History Patient is a very pleasant 55 y.o. who reports 1.5 years of low back pain. She did physical therapy last year for 1 month without relief. Her back pain has been worsening. She is s/p s/p right L4-5 TFESI 03/04/2020 with 50% relief initially but pain returned, right L4-5 TFESI 07/15/2020 with several days improvement, bilateral L5-S1 TFESI 08/16/2020 with left leg pain relief, but still with pain down the right leg, and right L4-5 TFESI 09/13/2020 with no relief. She reports pain in both sides of the lower back. No pain radiation down BLE at this time. She is reporting some weakness in her BLE and per patient she reports that MD noted more weakness in her RLE. She notices the weakness most notably when going up steps.. She denies any numbness, tingling or loss of control of bowel or bladder. She has been released from physiatry with a referral to see  neurosurgeon. MRI lumbar spine 10/2019: L2-3 mild CCS; L3-4 mild bilateral NFS; L4-5 mild to moderate CCS, mild left and severe right NFS due to right disc extrusion; moderate facet arthropathy L3-4 and L4-5. She has another MRI scheduled for next week to assess her back.    Limitations Standing;Walking    How long can you sit comfortably? 1 hour    How long can you stand comfortably? 15 minutes    How long can you walk comfortably? 30 minutes    Diagnostic tests See history    Patient Stated Goals Decrease back pain    Currently in Pain? Yes    Pain Score 1     Pain Location Back    Pain Orientation Right;Lower    Pain Onset More than a month ago          There ex:    bridge 2x10  TrA with SLR  2x15 each leg  Modified dead bug with weighted ball 3x10 SL hip abd 2x10 bilat. (increased reps today)  Resisted side stepping, double black band 10 laps each (increased reps today) Squats with pelvic resistance, double black band 2x10 Standing hip abd and ext with 3# AW 2x10 bilat (added today)     MMT 12/01/2020:  R hip abd 3+/5  L hip abd 4-/5    Updated HEP 12-01-20:  LTR Bridge SLR Clam Dead bug    next visit update HEP: SL hip abd, give band for clams, STS with weight, standing hip abd and ext.        PT Short Term Goals - 11/05/20 0920      PT SHORT TERM GOAL #1   Title Pt will be independent with HEP in order to improve strength and decrease back pain in order to improve pain-free function at home.    Time 4    Period Weeks    Status Achieved    Target Date 11/05/20             PT Long Term Goals - 11/19/20 0746      PT LONG TERM GOAL #1   Title Pt will decrease mODI score by at least 13 percent in order demonstrate clinically significant reduction in back pain/disability.    Baseline 10/02/20: 46%, 11/19/20: 24%    Time 8    Period --    Status Achieved    Target Date 11/19/20      PT LONG TERM GOAL #2   Title Pt will decrease worst back pain as reported on NPRS by at least 2 points in order to demonstrate clinically significant reduction in back pain.    Baseline 10/02/20: 8/10, 11/19/20: 1/10 today, 2/10 at worst the last week.    Time 8    Status Achieved    Target Date 11/19/20      PT LONG TERM GOAL #3   Title Patient improved FOTO to at least 55 in order to demonstrate significant provement in function    Baseline 10/02/20: 35, 11/19/20: 57    Status Achieved    Target Date 11/19/20      PT LONG TERM GOAL #4   Title Pt with increase B hip abd strength by 1/2 a grade to increase pelvic and spinal stability with WB tasks.    Baseline December 01, 2020: R hip abd 3+/5   L hip abd 4-/5    Time 4    Period Weeks    Status New    Target Date 12/17/20  Plan - 11/19/20 0802    Clinical Impression Statement Pt has achieved her FOTO, mODI score, and pain goal that was set at IE, which shows improvement in overall function and QoL.  New goal set for hip abd strength to increase pelvic and spinal stability with WB tasks. Progressed exercises today to address hip abd weakness and pt tolerated well with no c/o increased pain.    Personal Factors and Comorbidities Comorbidity 3+    Comorbidities CVA, lymphedema, OA, obesity    Examination-Activity Limitations Bed Mobility;Bend;Caring for Others;Squat;Stairs;Stand;Transfers    Examination-Participation Restrictions Community Activity;Other   Traveling   Stability/Clinical Decision Making Evolving/Moderate complexity    Clinical Decision Making Moderate    Rehab Potential Good    PT Frequency 2x / week    PT Duration 8 weeks    PT Treatment/Interventions ADLs/Self Care Home Management;Aquatic Therapy;Biofeedback;Canalith Repostioning;Cryotherapy;Electrical Stimulation;Iontophoresis 4mg /ml Dexamethasone;Moist Heat;Traction;Ultrasound;DME Instruction;Gait training;Stair training;Functional mobility training;Therapeutic exercise;Therapeutic activities;Balance training;Neuromuscular re-education;Patient/family education;Manual techniques;Dry needling;Vestibular;Joint Manipulations;Spinal Manipulations    PT Next Visit Plan next visit update HEP: SL hip abd, give band for clams, STS with weight, standing hip abd and ext.    PT Home Exercise Plan None currently    Consulted and Agree with Plan of Care Patient           Patient will benefit from skilled therapeutic intervention in order to improve the following deficits and impairments:  Decreased strength,Pain  Visit Diagnosis: Chronic bilateral low back pain, unspecified whether sciatica present  Muscle weakness (generalized)     Problem List Patient Active Problem List   Diagnosis Date Noted  . Varicose veins 04/22/2020  .  Chronic right-sided low back pain with right-sided sciatica 01/18/2020  . Foraminal stenosis of lumbar region 01/18/2020  . Lumbar disc herniation with radiculopathy 01/18/2020  . Lymphedema 10/16/2019  . Chronic venous insufficiency 10/16/2019  . DJD (degenerative joint disease) 10/16/2019  . Radicular leg pain 10/16/2019  . Ovarian fibroma, right 09/24/2017  . Post-operative state 09/24/2017  . Occlusion of left posterior cerebral artery 09/17/2017  . Occipital infarction ([place]) 09/04/2017  . Sudden visual loss, right eye 09/02/2017  . Cellulitis 08/28/2017  . Intra-abdominal and pelvic swelling, mass and lump, unspecified site 08/24/2017  . Bilateral foot pain 01/12/2017  . Burning sensation of feet 01/12/2017  . Hernia, hiatal 11/06/2016  . Morbid obesity with BMI of 40.0-44.9, adult ([place]) 07/01/2016  . Pain in toe of right foot 12/19/2015  . Trigger ring finger of left hand 11/28/2015  . Tear of meniscus of knee 07/03/2015  . Pain in left knee 06/04/2015  . Primary osteoarthritis of left knee 06/04/2015  . Ganglion cyst of foot 02/11/2015  . Hammer toe of right foot 02/11/2015  . Cardiac murmur 03/22/2014  . Vitamin D deficiency 05/16/2013   Pura Spice, PT, DPT # [id number] Kayleen Memos, SPT 11/19/2020, 11:33 AM   [facility] [address] [place], [place], [address] Phone: [phone]   Fax:  [phone]  Name: Shari Parks MRN: [id number] Date of Birth: 12/09/1965

## 2020-11-21 ENCOUNTER — Ambulatory Visit: Payer: BC Managed Care – PPO | Admitting: Physical Therapy

## 2020-11-21 ENCOUNTER — Other Ambulatory Visit: Payer: Self-pay

## 2020-11-21 ENCOUNTER — Encounter: Payer: Self-pay | Admitting: Physical Therapy

## 2020-11-21 DIAGNOSIS — M6281 Muscle weakness (generalized): Secondary | ICD-10-CM

## 2020-11-21 DIAGNOSIS — M545 Low back pain, unspecified: Secondary | ICD-10-CM

## 2020-11-21 DIAGNOSIS — G8929 Other chronic pain: Secondary | ICD-10-CM

## 2020-11-21 NOTE — Therapy (Signed)
[place] [facility] [address] [place], [place], [address] Phone: [phone]   Fax:  [phone]  Physical Therapy Treatment/DISCHARGE  Patient Details  Name: Shari Parks MRN: [id number] Date of Birth: Feb 09, 1966 Referring Provider (PT): Dr. Alba Destine   Encounter Date: 11/21/2020   PT End of Session - 11/21/20 0805    Visit Number 11    Number of Visits 17    Date for PT Re-Evaluation 11/27/20    Authorization Type eval: 10/03/19    Authorization - Visit Number 1    Authorization - Number of Visits 10    PT Start Time 0730    PT Stop Time 0805    PT Time Calculation (min) 35 min    Activity Tolerance Patient tolerated treatment well    Behavior During Therapy [facility] for tasks assessed/performed           Past Medical History:  Diagnosis Date   Arthritis    left knee   Heart murmur    "slight" - followed by PCP   History of hiatal hernia    Hypercholesterolemia    Lymphedema    Stroke ([place])    Occlusion of left posterior cerebral artery   Varicose vein of leg    Wears contact lenses     Past Surgical History:  Procedure Laterality Date   ABDOMINAL HYSTERECTOMY     CHOLECYSTECTOMY  6/92   COLONOSCOPY  04/30/14   DIAGNOSTIC LAPAROSCOPY     ESOPHAGOGASTRIC FUNDOPLASTY  2012   ESOPHAGOGASTRODUODENOSCOPY (EGD) WITH PROPOFOL N/A 08/06/2020   Procedure: ESOPHAGOGASTRODUODENOSCOPY (EGD) WITH PROPOFOL;  Surgeon: Lesly Rubenstein, MD;  Location: [facility] ENDOSCOPY;  Service: Endoscopy;  Laterality: N/A;   HAMMER TOE SURGERY Right 04/05/13   KNEE ARTHROSCOPY Left 11/22/2015   Procedure: ARTHROSCOPY KNEE WITH CHONDROPLASTY MEDIAL FEMORAL CONDYLE;  Surgeon: Leanor Kail, MD;  Location: [place];  Service: Orthopedics;  Laterality: Left;  order of cases moved per CeCe and Dr. Leslye Peer   LAPAROSCOPIC GASTRIC SLEEVE RESECTION  12/2010   NOVASURE ABLATION  approx 2011   TONSILLECTOMY  6/84    There were no  vitals filed for this visit.   Subjective Assessment - 11/21/20 0732    Subjective Pt reports a pain score of 1/10 in her R LB today and she has been having a really good week so far. She had her last shot in her L knee a couple days ago which made it slightly sore, but no pain today.    Pertinent History Patient is a very pleasant 55 y.o. who reports 1.5 years of low back pain. She did physical therapy last year for 1 month without relief. Her back pain has been worsening. She is s/p s/p right L4-5 TFESI 03/04/2020 with 50% relief initially but pain returned, right L4-5 TFESI 07/15/2020 with several days improvement, bilateral L5-S1 TFESI 08/16/2020 with left leg pain relief, but still with pain down the right leg, and right L4-5 TFESI 09/13/2020 with no relief. She reports pain in both sides of the lower back. No pain radiation down BLE at this time. She is reporting some weakness in her BLE and per patient she reports that MD noted more weakness in her RLE. She notices the weakness most notably when going up steps.. She denies any numbness, tingling or loss of control of bowel or bladder. She has been released from physiatry with a referral to see neurosurgeon. MRI lumbar spine 10/2019: L2-3 mild CCS; L3-4 mild bilateral NFS; L4-5  mild to moderate CCS, mild left and severe right NFS due to right disc extrusion; moderate facet arthropathy L3-4 and L4-5. She has another MRI scheduled for next week to assess her back.    Limitations Standing;Walking    How long can you sit comfortably? 1 hour    How long can you stand comfortably? 1 hour    How long can you walk comfortably? 30 minutes    Diagnostic tests See history    Patient Stated Goals Decrease back pain    Currently in Pain? Yes    Pain Score 1     Pain Location Back    Pain Orientation Right;Lower    Pain Onset More than a month ago             There ex:    bridge 2x10  Modified dead bug with weighted ball 3x10 SL hip abd 2x10  bilat. Resisted side stepping, double black band 10 laps each  Squats with pelvic resistance, double black band 2x10 Standing hip abd and ext with 3# AW 2x10 bilat      Updated HEP 30-Nov-2020:  LTR Bridge SLR Clam Dead bug   Added today: SL hip abd, clams with RTB, bridge with hip abd, STS with weight, standing hip abd and ext.           PT Education - 11/21/20 0806    Education Details updated HEP (SL hip abd, clams with RTB, bridge with hip abd, STS with weight, standing hip abd and ext.)    Person(s) Educated Patient    Methods Explanation;Demonstration;Handout    Comprehension Verbalized understanding;Returned demonstration            PT Short Term Goals - 11/05/20 0920      PT SHORT TERM GOAL #1   Title Pt will be independent with HEP in order to improve strength and decrease back pain in order to improve pain-free function at home.    Time 4    Period Weeks    Status Achieved    Target Date 11/05/20             PT Long Term Goals - 11/21/20 7622      PT LONG TERM GOAL #1   Title Pt will decrease mODI score by at least 13 percent in order demonstrate clinically significant reduction in back pain/disability.    Baseline 10/02/20: 46%, 11/19/20: 24%    Time 8    Status Achieved      PT LONG TERM GOAL #2   Title Pt will decrease worst back pain as reported on NPRS by at least 2 points in order to demonstrate clinically significant reduction in back pain.    Baseline 10/02/20: 8/10, 11/19/20: 1/10 today, 2/10 at worst the last week.    Time 8    Status Achieved      PT LONG TERM GOAL #3   Title Patient improved FOTO to at least 55 in order to demonstrate significant provement in function    Baseline 10/02/20: 35, 11/19/20: 57    Status Achieved      PT LONG TERM GOAL #4   Title Pt with increase B hip abd strength by 1/2 a grade to increase pelvic and spinal stability with WB tasks.    Baseline 11-30-2020: R hip abd 3+/5   L hip abd 4-/5, 11/21/20: 4-/5 R abd, 4/5 L  abd    Time 4    Period Weeks    Status Achieved  Target Date 11/21/20                 Plan - 11/21/20 0809    Clinical Impression Statement Progressed HEP to address hip abd strength deficits and pt tolerated well. Pt showed improved neuromuscular control of the B hip abductors evident by updated MMT measurements. Pt has achieved all other LTGs and is being DC'ed to an HEP at this time.  Pt. instructed to contact PT if any questions or further issues.    Personal Factors and Comorbidities Comorbidity 3+    Comorbidities CVA, lymphedema, OA, obesity    Examination-Activity Limitations Bed Mobility;Bend;Caring for Others;Squat;Stairs;Stand;Transfers    Examination-Participation Restrictions Community Activity;Other   Traveling   Stability/Clinical Decision Making Evolving/Moderate complexity    Clinical Decision Making Moderate    Rehab Potential Good    PT Frequency 2x / week    PT Duration 8 weeks    PT Treatment/Interventions ADLs/Self Care Home Management;Aquatic Therapy;Biofeedback;Canalith Repostioning;Cryotherapy;Electrical Stimulation;Iontophoresis 4mg /ml Dexamethasone;Moist Heat;Traction;Ultrasound;DME Instruction;Gait training;Stair training;Functional mobility training;Therapeutic exercise;Therapeutic activities;Balance training;Neuromuscular re-education;Patient/family education;Manual techniques;Dry needling;Vestibular;Joint Manipulations;Spinal Manipulations    PT Next Visit Plan Pt DC'ed today.    PT Home Exercise Plan None currently    Consulted and Agree with Plan of Care Patient           Patient will benefit from skilled therapeutic intervention in order to improve the following deficits and impairments:  Decreased strength,Pain  Visit Diagnosis: Chronic bilateral low back pain, unspecified whether sciatica present  Muscle weakness (generalized)     Problem List Patient Active Problem List   Diagnosis Date Noted   Varicose veins 04/22/2020    Chronic right-sided low back pain with right-sided sciatica 01/18/2020   Foraminal stenosis of lumbar region 01/18/2020   Lumbar disc herniation with radiculopathy 01/18/2020   Lymphedema 10/16/2019   Chronic venous insufficiency 10/16/2019   DJD (degenerative joint disease) 10/16/2019   Radicular leg pain 10/16/2019   Ovarian fibroma, right 09/24/2017   Post-operative state 09/24/2017   Occlusion of left posterior cerebral artery 09/17/2017   Occipital infarction ([place]) 09/04/2017   Sudden visual loss, right eye 09/02/2017   Cellulitis 08/28/2017   Intra-abdominal and pelvic swelling, mass and lump, unspecified site 08/24/2017   Bilateral foot pain 01/12/2017   Burning sensation of feet 01/12/2017   Hernia, hiatal 11/06/2016   Morbid obesity with BMI of 40.0-44.9, adult ([place]) 07/01/2016   Pain in toe of right foot 12/19/2015   Trigger ring finger of left hand 11/28/2015   Tear of meniscus of knee 07/03/2015   Pain in left knee 06/04/2015   Primary osteoarthritis of left knee 06/04/2015   Ganglion cyst of foot 02/11/2015   Hammer toe of right foot 02/11/2015   Cardiac murmur 03/22/2014   Vitamin D deficiency 05/16/2013   Pura Spice, PT, DPT # [place], SPT 11/21/2020, 8:57 AM  [place] [facility] [address] [place], [place], [address] Phone: [phone]   Fax:  [phone]  Name: Shari Parks MRN: [id number] Date of Birth: September 13, 1966

## 2020-11-26 ENCOUNTER — Encounter: Payer: Self-pay | Admitting: Physical Therapy

## 2020-11-28 ENCOUNTER — Encounter: Payer: Self-pay | Admitting: Physical Therapy

## 2021-04-14 ENCOUNTER — Other Ambulatory Visit: Payer: Self-pay

## 2021-04-14 ENCOUNTER — Ambulatory Visit (INDEPENDENT_AMBULATORY_CARE_PROVIDER_SITE_OTHER): Payer: BC Managed Care – PPO | Admitting: Vascular Surgery

## 2021-04-14 ENCOUNTER — Encounter (INDEPENDENT_AMBULATORY_CARE_PROVIDER_SITE_OTHER): Payer: Self-pay | Admitting: Vascular Surgery

## 2021-04-14 VITALS — BP 143/111 | HR 61 | Resp 16 | Ht 66.0 in | Wt 240.0 lb

## 2021-04-14 DIAGNOSIS — I872 Venous insufficiency (chronic) (peripheral): Secondary | ICD-10-CM

## 2021-04-14 DIAGNOSIS — E7800 Pure hypercholesterolemia, unspecified: Secondary | ICD-10-CM | POA: Diagnosis not present

## 2021-04-14 DIAGNOSIS — M159 Polyosteoarthritis, unspecified: Secondary | ICD-10-CM

## 2021-04-14 DIAGNOSIS — I890 Lymphedema, not elsewhere classified: Secondary | ICD-10-CM | POA: Diagnosis not present

## 2021-04-14 DIAGNOSIS — M8949 Other hypertrophic osteoarthropathy, multiple sites: Secondary | ICD-10-CM

## 2021-04-14 DIAGNOSIS — M150 Primary generalized (osteo)arthritis: Secondary | ICD-10-CM

## 2021-04-14 NOTE — Progress Notes (Signed)
MRN : [id number]  Shari Parks is a 55 y.o. (January 23, 1966) female who presents with chief complaint of  Chief Complaint  Patient presents with   Follow-up    1 yr no studies  .  History of Present Illness:   The patient returns to the office for followup evaluation regarding leg swelling.  The swelling has improved quite a bit and the pain associated with swelling has decreased substantially. There have not been any interval development of a ulcerations or wounds.   Since the previous visit the patient has been wearing graduated compression stockings and has noted little significant improvement in the lymphedema. The patient has been using compression routinely morning until night.   The patient also states elevation during the day and exercise is being done too.   She is using her lymphedema pump on a daily basis.  She states it has been a tremendous benefit.  Current Meds  Medication Sig   Alpha-Lipoic Acid 600 MG CAPS Take by mouth.   aspirin 81 MG tablet Take 81 mg by mouth daily.   cholecalciferol (VITAMIN D3) 25 MCG (1000 UNIT) tablet Take 1,000 Units by mouth daily.   Melatonin 10 MG TABS Take 10 mg by mouth at bedtime.   Multiple Vitamin (MULTIVITAMIN) capsule Take 1 capsule by mouth daily.   Red Yeast Rice Extract (RED YEAST RICE PO) Take by mouth.   vitamin B-12 (CYANOCOBALAMIN) 100 MCG tablet Take 100 mcg by mouth daily.    Past Medical History:  Diagnosis Date   Arthritis    left knee   Heart murmur    "slight" - followed by PCP   History of hiatal hernia    Hypercholesterolemia    Lymphedema    Stroke ([place])    Occlusion of left posterior cerebral artery   Varicose vein of leg    Wears contact lenses     Past Surgical History:  Procedure Laterality Date   ABDOMINAL HYSTERECTOMY     CHOLECYSTECTOMY  6/92   COLONOSCOPY  04/30/14   DIAGNOSTIC LAPAROSCOPY     ESOPHAGOGASTRIC FUNDOPLASTY  2012   ESOPHAGOGASTRODUODENOSCOPY (EGD) WITH PROPOFOL N/A  08/06/2020   Procedure: ESOPHAGOGASTRODUODENOSCOPY (EGD) WITH PROPOFOL;  Surgeon: Lesly Rubenstein, MD;  Location: [facility] ENDOSCOPY;  Service: Endoscopy;  Laterality: N/A;   HAMMER TOE SURGERY Right 04/05/13   KNEE ARTHROSCOPY Left 11/22/2015   Procedure: ARTHROSCOPY KNEE WITH CHONDROPLASTY MEDIAL FEMORAL CONDYLE;  Surgeon: Leanor Kail, MD;  Location: [place];  Service: Orthopedics;  Laterality: Left;  order of cases moved per CeCe and Dr. Leslye Peer   LAPAROSCOPIC GASTRIC SLEEVE RESECTION  12/2010   NOVASURE ABLATION  approx 2011   TONSILLECTOMY  6/84    Social History Social History   Tobacco Use   Smoking status: Never   Smokeless tobacco: Never  Substance Use Topics   Alcohol use: Yes    Alcohol/week: 2.0 - 4.0 standard drinks    Types: 2 - 4 Glasses of wine per week   Drug use: Never    Family History Family History  Problem Relation Age of Onset   Breast cancer Sister 61   Breast cancer Niece 66    Allergies  Allergen Reactions   Sulfa Antibiotics Swelling, Anaphylaxis and Rash    Tongue, high fever Other reaction(s): Other (See Comments), Other (See Comments) Fever and tongue swells Fever and tongue swells  Other reaction(s): Other (See Comments) Fever and tongue swells Tongue, high fever Other reaction(s): Other (See Comments), Other (  See Comments) Fever and tongue swells Fever and tongue swells   Sulfasalazine Swelling    Tongue, high fever   Tetanus Toxoids Anaphylaxis and Other (See Comments)    High fever     REVIEW OF SYSTEMS (Negative unless checked)  Constitutional: [] Weight loss  [] Fever  [] Chills Cardiac: [] Chest pain   [] Chest pressure   [] Palpitations   [] Shortness of breath when laying flat   [] Shortness of breath with exertion. Vascular:  [] Pain in legs with walking   [] Pain in legs at rest  [] History of DVT   [] Phlebitis   [x] Swelling in legs   [] Varicose veins   [] Non-healing ulcers Pulmonary:   [] Uses home oxygen   [] Productive  cough   [] Hemoptysis   [] Wheeze  [] COPD   [] Asthma Neurologic:  [] Dizziness   [] Seizures   [] History of stroke   [] History of TIA  [] Aphasia   [] Vissual changes   [] Weakness or numbness in arm   [] Weakness or numbness in leg Musculoskeletal:   [] Joint swelling   [] Joint pain   [] Low back pain Hematologic:  [] Easy bruising  [] Easy bleeding   [] Hypercoagulable state   [] Anemic Gastrointestinal:  [] Diarrhea   [] Vomiting  [] Gastroesophageal reflux/heartburn   [] Difficulty swallowing. Genitourinary:  [] Chronic kidney disease   [] Difficult urination  [] Frequent urination   [] Blood in urine Skin:  [] Rashes   [] Ulcers  Psychological:  [] History of anxiety   []  History of major depression.  Physical Examination  Vitals:   04/14/21 1114  BP: (!) 143/111  Pulse: 61  Resp: 16  Weight: 240 lb (108.9 kg)  Height: 5\' 6"  (1.676 m)   Body mass index is 38.74 kg/m. Gen: WD/WN, NAD Head: [place]/AT, No temporalis wasting.  Ear/Nose/Throat: Hearing grossly intact, nares w/o erythema or drainage Eyes: PER, EOMI, sclera nonicteric.  Neck: Supple, no large masses.   Pulmonary:  Good air movement, no audible wheezing bilaterally, no use of accessory muscles.  Cardiac: RRR, no JVD Vascular:  scattered varicosities present bilaterally.  Moderate venous stasis changes to the legs bilaterally.  3+ soft pitting edema  Vessel Right Left  Radial Palpable Palpable  PT Palpable Palpable  DP Palpable Palpable  Gastrointestinal: Non-distended. No guarding/no peritoneal signs.  Musculoskeletal: M/S 5/5 throughout.  No deformity or atrophy.  Neurologic: CN 2-12 intact. Symmetrical.  Speech is fluent. Motor exam as listed above. Psychiatric: Judgment intact, Mood & affect appropriate for pt's clinical situation. Dermatologic: No rashes or ulcers noted.  No changes consistent with cellulitis. Lymph : Mild lichenification / skin changes of chronic lymphedema.  CBC No results found for: WBC, HGB, HCT, MCV,  PLT  BMET No results found for: NA, K, CL, CO2, GLUCOSE, BUN, CREATININE, CALCIUM, GFRNONAA, GFRAA CrCl cannot be calculated (No successful lab value found.).  COAG No results found for: INR, PROTIME  Radiology No results found.   Assessment/Plan 1. Lymphedema  No surgery or intervention at this point in time.    I have reviewed my discussion with the patient regarding lymphedema and why it  causes symptoms.  Patient will continue wearing graduated compression stockings class 1 (20-30 mmHg) on a daily basis a prescription was given. The patient is reminded to put the stockings on first thing in the morning and removing them in the evening. The patient is instructed specifically not to sleep in the stockings.   In addition, behavioral modification throughout the day will be continued.  This will include frequent elevation (such as in a recliner), use of over the counter pain  medications as needed and exercise such as walking.  I have reviewed systemic causes for chronic edema such as liver, kidney and cardiac etiologies and there does not appear to be any significant changes in these organ systems over the past year.  The patient is under the impression that these organ systems are all stable and unchanged.    The patient will continue aggressive use of the  lymph pump.  This will continue to improve the edema control and prevent sequela such as ulcers and infections.   The patient will follow-up with me on an annual basis.    2. Chronic venous insufficiency  No surgery or intervention at this point in time.    I have reviewed my discussion with the patient regarding lymphedema and why it  causes symptoms.  Patient will continue wearing graduated compression stockings class 1 (20-30 mmHg) on a daily basis a prescription was given. The patient is reminded to put the stockings on first thing in the morning and removing them in the evening. The patient is instructed specifically not to  sleep in the stockings.   In addition, behavioral modification throughout the day will be continued.  This will include frequent elevation (such as in a recliner), use of over the counter pain medications as needed and exercise such as walking.  I have reviewed systemic causes for chronic edema such as liver, kidney and cardiac etiologies and there does not appear to be any significant changes in these organ systems over the past year.  The patient is under the impression that these organ systems are all stable and unchanged.    The patient will continue aggressive use of the  lymph pump.  This will continue to improve the edema control and prevent sequela such as ulcers and infections.   The patient will follow-up with me on an annual basis.    3. Primary osteoarthritis involving multiple joints Continue NSAID medications as already ordered, these medications have been reviewed and there are no changes at this time.  Continued activity and therapy was stressed.   4. Hypercholesterolemia Continue statin as ordered and reviewed, no changes at this time     Hortencia Pilar, MD  04/14/2021 11:24 AM

## 2021-04-15 ENCOUNTER — Encounter (INDEPENDENT_AMBULATORY_CARE_PROVIDER_SITE_OTHER): Payer: Self-pay | Admitting: Vascular Surgery

## 2021-06-25 ENCOUNTER — Other Ambulatory Visit: Payer: Self-pay | Admitting: Gerontology

## 2021-06-25 DIAGNOSIS — Z1231 Encounter for screening mammogram for malignant neoplasm of breast: Secondary | ICD-10-CM

## 2021-07-29 IMAGING — MG DIGITAL SCREENING BILAT W/ TOMO
8 series · 8 of 24 positions shown · non-contrast
Comparison: Previous exam(s).

CLINICAL DATA: Screening.

EXAM:
DIGITAL SCREENING BILATERAL MAMMOGRAM WITH TOMO AND CAD

[L MLO synth-2D]
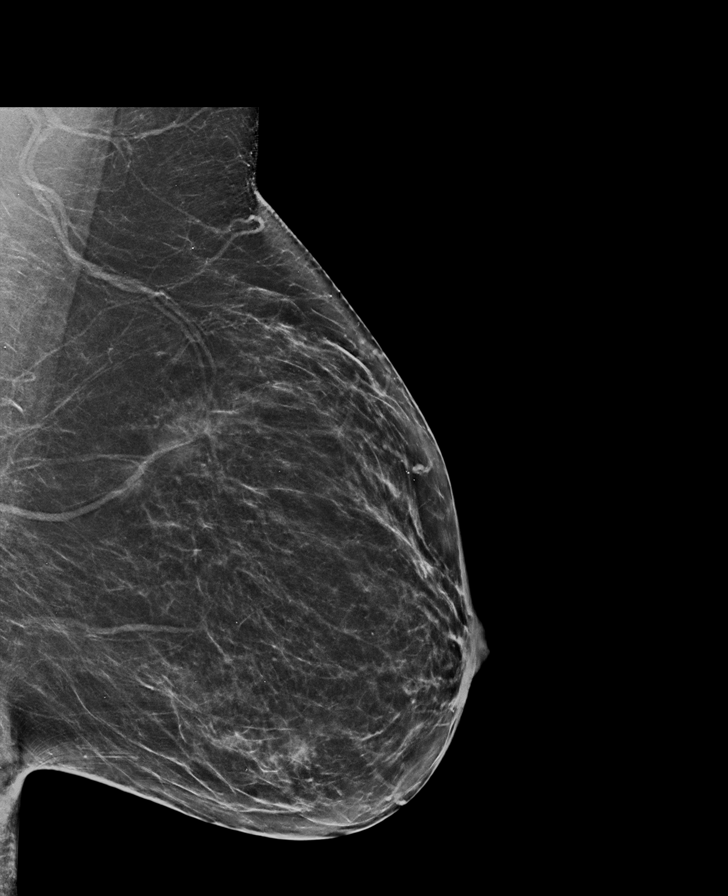

[R CC synth-2D]
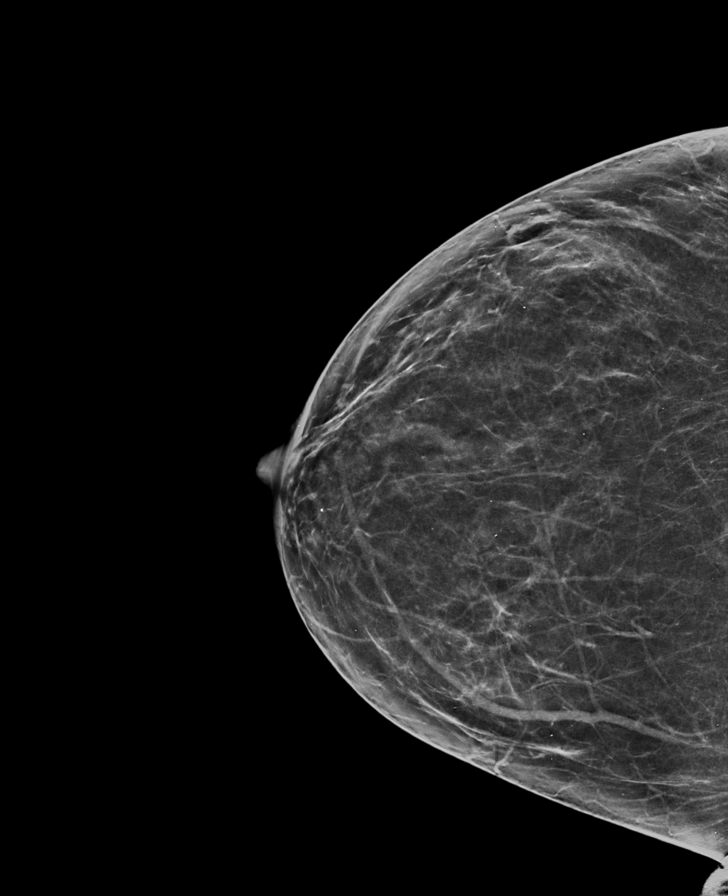

[R MLO synth-2D]
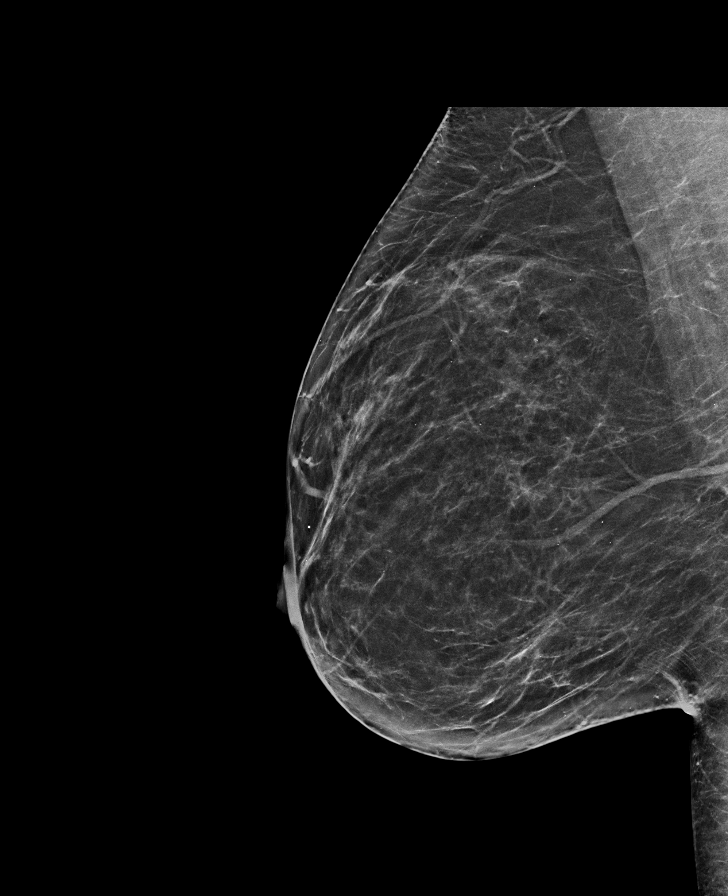

[L CC synth-2D]
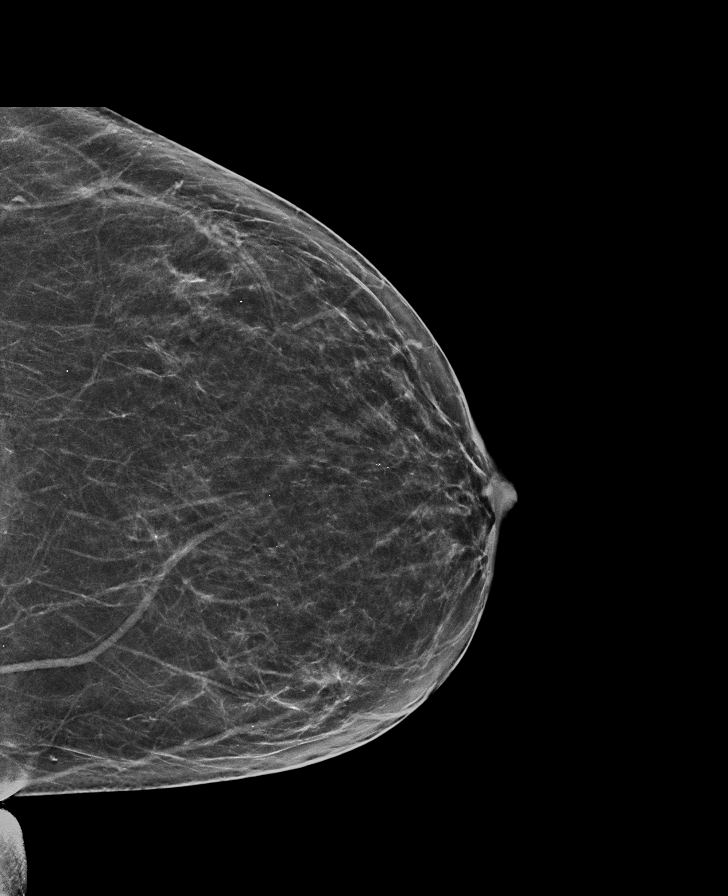

[L CC tomo · tomo slice 33/66.0]
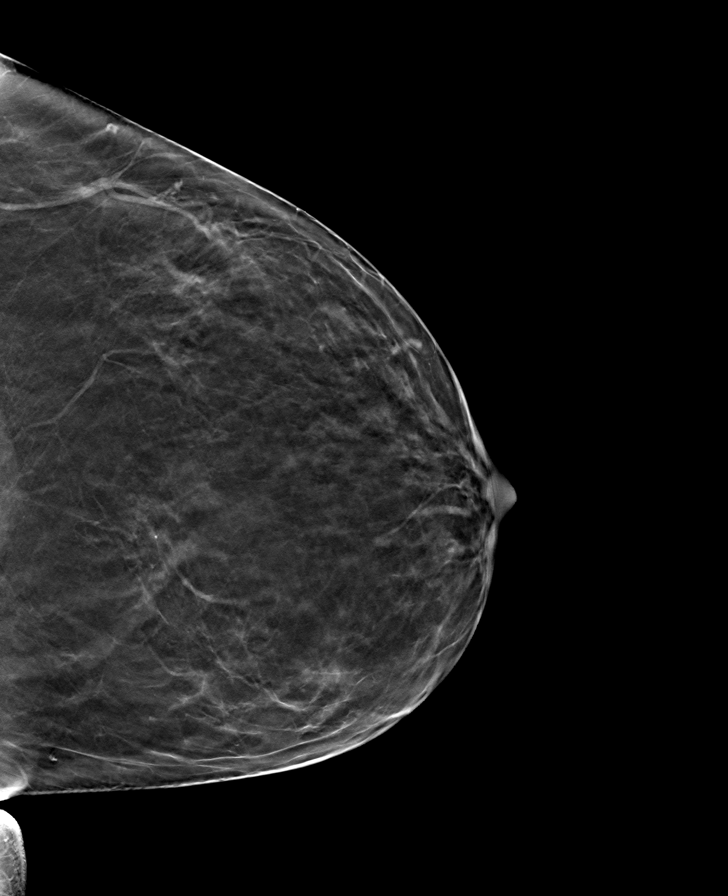

[R MLO tomo · tomo slice 36/71.0]
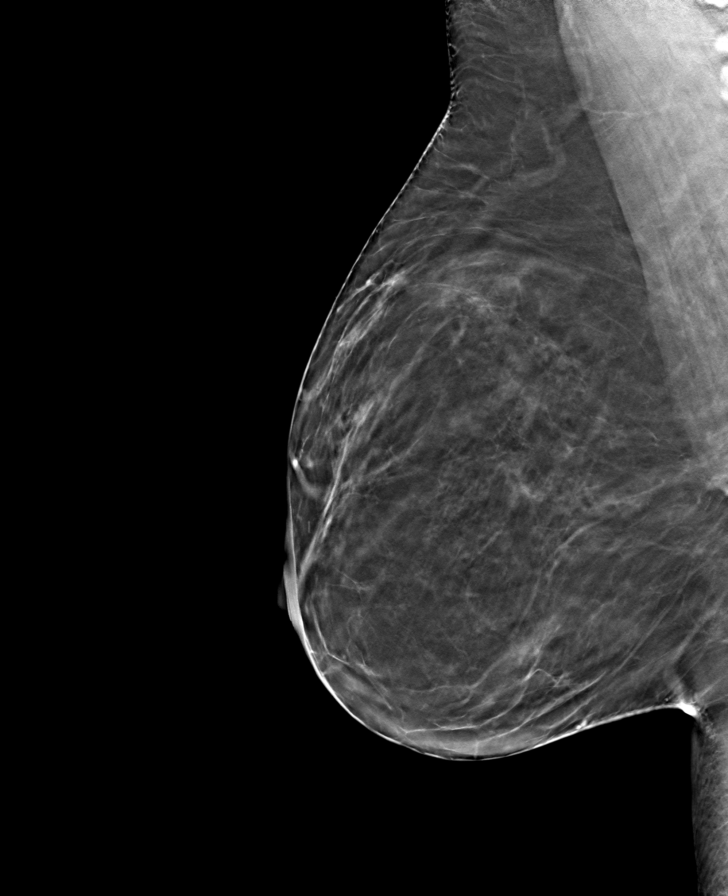

[L MLO tomo · tomo slice 37/73.0]
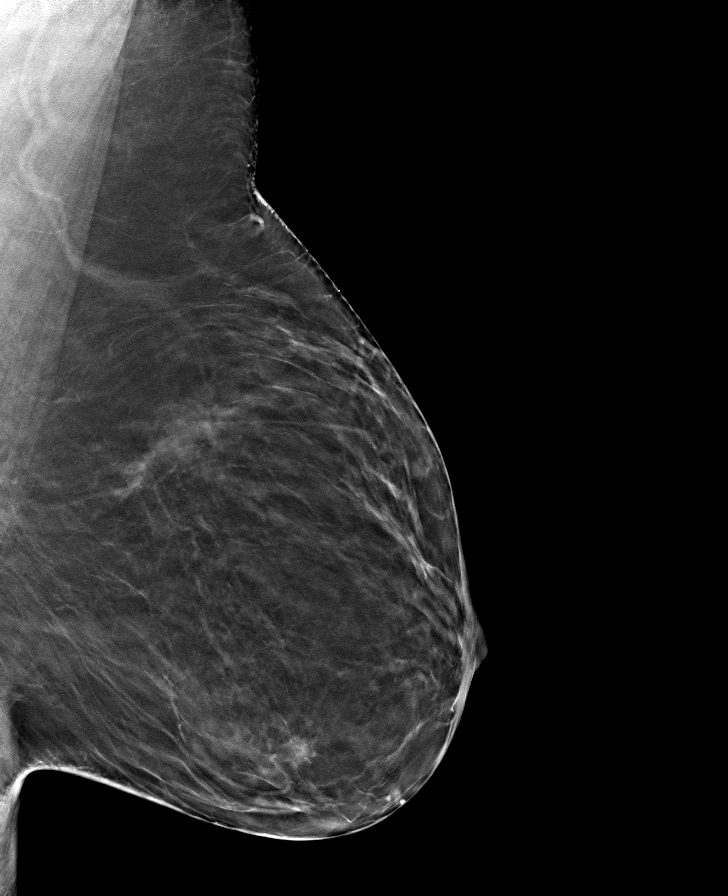

[R CC tomo · tomo slice 33/65.0]
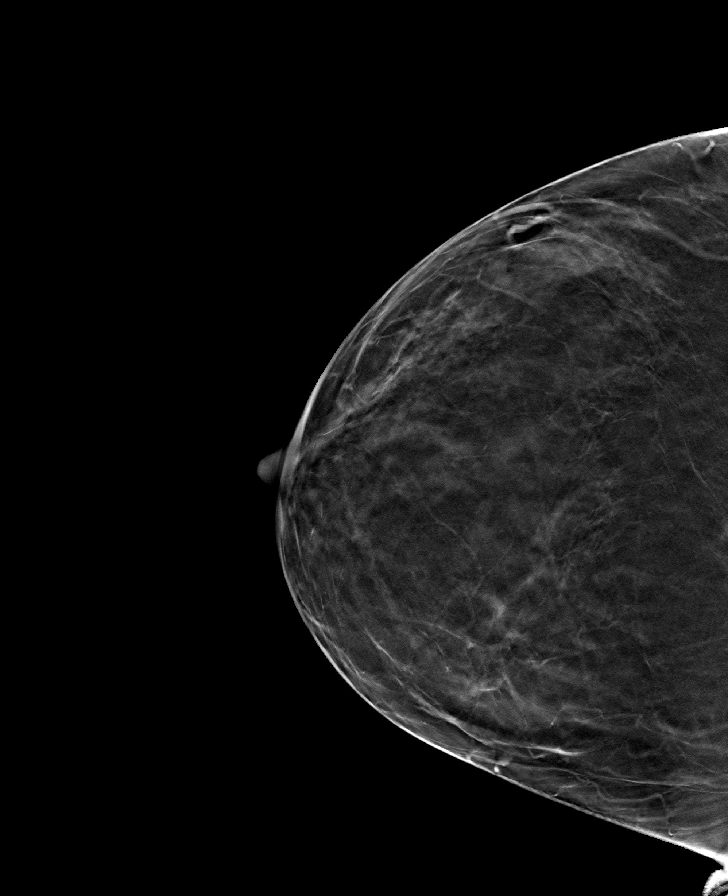

[8 of 24 positions shown; findings below may reference images not displayed]

ACR Breast Density Category c: The breast tissue is heterogeneously
dense, which may obscure small masses.
FINDINGS: There are no findings suspicious for malignancy. Images were
processed with CAD.
IMPRESSION: No mammographic evidence of malignancy. A result letter of this
screening mammogram will be mailed directly to the patient.

RECOMMENDATION:
Screening mammogram in one year. (Code:FT-U-LHB)

BI-RADS CATEGORY  1: Negative.

## 2021-08-12 ENCOUNTER — Other Ambulatory Visit: Payer: Self-pay

## 2021-08-12 ENCOUNTER — Ambulatory Visit
Admission: RE | Admit: 2021-08-12 | Discharge: 2021-08-12 | Disposition: A | Discharge status: Home / Self Care | Payer: BC Managed Care – PPO | Source: Ambulatory Visit | Attending: Gerontology | Admitting: Gerontology

## 2021-08-12 DIAGNOSIS — Z1231 Encounter for screening mammogram for malignant neoplasm of breast: Secondary | ICD-10-CM | POA: Insufficient documentation

## 2022-04-12 NOTE — Progress Notes (Unsigned)
MRN : [id number]  Shari Parks is a 56 y.o. ([birth year]) female who presents with chief complaint of legs swell.  History of Present Illness:   The patient returns to the office for followup evaluation regarding leg swelling.  The swelling has improved quite a bit and the pain associated with swelling has decreased substantially. There have not been any interval development of a ulcerations or wounds.   Since the previous visit the patient has been wearing graduated compression stockings and has noted little significant improvement in the lymphedema. The patient has been using compression routinely morning until night.   The patient also states elevation during the day and exercise is being done too.   She is using her lymphedema pump on a daily basis.  She states the pump continues to be a  benefit.  No outpatient medications have been marked as taking for the 04/13/22 encounter (Appointment) with Delana Meyer, Dolores Lory, MD.    Past Medical History:  Diagnosis Date   Arthritis    left knee   Heart murmur    "slight" - followed by PCP   History of hiatal hernia    Hypercholesterolemia    Lymphedema    Stroke ([place])    Occlusion of left posterior cerebral artery   Varicose vein of leg    Wears contact lenses     Past Surgical History:  Procedure Laterality Date   ABDOMINAL HYSTERECTOMY     CHOLECYSTECTOMY  6/92   COLONOSCOPY  04/30/14   DIAGNOSTIC LAPAROSCOPY     ESOPHAGOGASTRIC FUNDOPLASTY  2012   ESOPHAGOGASTRODUODENOSCOPY (EGD) WITH PROPOFOL N/A 08/06/2020   Procedure: ESOPHAGOGASTRODUODENOSCOPY (EGD) WITH PROPOFOL;  Surgeon: Lesly Rubenstein, MD;  Location: [facility];  Service: Endoscopy;  Laterality: N/A;   HAMMER TOE SURGERY Right 04/05/13   KNEE ARTHROSCOPY Left 11/22/2015   Procedure: ARTHROSCOPY KNEE WITH CHONDROPLASTY MEDIAL FEMORAL CONDYLE;  Surgeon: Leanor Kail, MD;  Location: [place];  Service: Orthopedics;  Laterality: Left;  order of  cases moved per CeCe and Dr. Leslye Peer   LAPAROSCOPIC GASTRIC SLEEVE RESECTION  12/2010   NOVASURE ABLATION  approx 2011   TONSILLECTOMY  6/84    Social History Social History   Tobacco Use   Smoking status: Never   Smokeless tobacco: Never  Substance Use Topics   Alcohol use: Yes    Alcohol/week: 2.0 - 4.0 standard drinks of alcohol    Types: 2 - 4 Glasses of wine per week   Drug use: Never    Family History Family History  Problem Relation Age of Onset   Breast cancer Sister 55   Breast cancer Niece 78    Allergies  Allergen Reactions   Sulfa Antibiotics Swelling, Anaphylaxis and Rash    Tongue, high fever Other reaction(s): Other (See Comments), Other (See Comments) Fever and tongue swells Fever and tongue swells  Other reaction(s): Other (See Comments) Fever and tongue swells Tongue, high fever Other reaction(s): Other (See Comments), Other (See Comments) Fever and tongue swells Fever and tongue swells   Sulfasalazine Swelling    Tongue, high fever   Tetanus Toxoids Anaphylaxis and Other (See Comments)    High fever     REVIEW OF SYSTEMS (Negative unless checked)  Constitutional: '[]'$ Weight loss  '[]'$ Fever  '[]'$ Chills Cardiac: '[]'$ Chest pain   '[]'$ Chest pressure   '[]'$ Palpitations   '[]'$ Shortness of breath when laying flat   '[]'$ Shortness of breath with exertion. Vascular:  '[]'$ Pain in legs with walking   '[x]'$ Pain in  legs with standing  '[]'$ History of DVT   '[]'$ Phlebitis   '[x]'$ Swelling in legs   '[]'$ Varicose veins   '[]'$ Non-healing ulcers Pulmonary:   '[]'$ Uses home oxygen   '[]'$ Productive cough   '[]'$ Hemoptysis   '[]'$ Wheeze  '[]'$ COPD   '[]'$ Asthma Neurologic:  '[]'$ Dizziness   '[]'$ Seizures   '[]'$ History of stroke   '[]'$ History of TIA  '[]'$ Aphasia   '[]'$ Vissual changes   '[]'$ Weakness or numbness in arm   '[]'$ Weakness or numbness in leg Musculoskeletal:   '[]'$ Joint swelling   '[]'$ Joint pain   '[]'$ Low back pain Hematologic:  '[]'$ Easy bruising  '[]'$ Easy bleeding   '[]'$ Hypercoagulable state   '[]'$ Anemic Gastrointestinal:  '[]'$ Diarrhea    '[]'$ Vomiting  '[]'$ Gastroesophageal reflux/heartburn   '[]'$ Difficulty swallowing. Genitourinary:  '[]'$ Chronic kidney disease   '[]'$ Difficult urination  '[]'$ Frequent urination   '[]'$ Blood in urine Skin:  '[]'$ Rashes   '[]'$ Ulcers  Psychological:  '[]'$ History of anxiety   '[]'$  History of major depression.  Physical Examination  There were no vitals filed for this visit. There is no height or weight on file to calculate BMI. Gen: WD/WN, NAD Head: [place]/AT, No temporalis wasting.  Ear/Nose/Throat: Hearing grossly intact, nares w/o erythema or drainage, pinna without lesions Eyes: PER, EOMI, sclera nonicteric.  Neck: Supple, no gross masses.  No JVD.  Pulmonary:  Good air movement, no audible wheezing, no use of accessory muscles.  Cardiac: RRR, precordium not hyperdynamic. Vascular:  scattered varicosities present bilaterally.  Mild venous stasis changes to the legs bilaterally. 2-3+ soft pitting edema  Vessel Right Left  Radial Palpable Palpable  Gastrointestinal: soft, non-distended. No guarding/no peritoneal signs.  Musculoskeletal: M/S 5/5 throughout.  No deformity.  Neurologic: CN 2-12 intact. Pain and light touch intact in extremities.  Symmetrical.  Speech is fluent. Motor exam as listed above. Psychiatric: Judgment intact, Mood & affect appropriate for pt's clinical situation. Dermatologic: Venous rashes no ulcers noted.  No changes consistent with cellulitis. Lymph : No lichenification or skin changes of chronic lymphedema.  CBC No results found for: "WBC", "HGB", "HCT", "MCV", "PLT"  BMET No results found for: "NA", "K", "CL", "CO2", "GLUCOSE", "BUN", "CREATININE", "CALCIUM", "GFRNONAA", "GFRAA" CrCl cannot be calculated (No successful lab value found.).  COAG No results found for: "INR", "PROTIME"  Radiology No results found.   Assessment/Plan 1. Lymphedema Recommend:  No surgery or intervention at this point in time.    I have reviewed my discussion with the patient regarding lymphedema and  why it  causes symptoms.  Patient will continue wearing graduated compression on a daily basis. The patient should put the compression on first thing in the morning and removing them in the evening. The patient should not sleep in the compression.   In addition, behavioral modification throughout the day will be continued.  This will include frequent elevation (such as in a recliner), use of over the counter pain medications as needed and exercise such as walking.  The systemic causes for chronic edema such as liver, kidney and cardiac etiologies does not appear to have significant changed over the past year.    The patient will continue aggressive use of the  lymph pump.  This will continue to improve the edema control and prevent sequela such as ulcers and infections.   The patient will follow-up with me on a PRN basis.    2. Chronic venous insufficiency See #1  3. Hypercholesterolemia Continue statin as ordered and reviewed, no changes at this time   4. Primary osteoarthritis involving multiple joints Continue NSAID medications as already ordered, these  medications have been reviewed and there are no changes at this time.  Continued activity and therapy was stressed.     Hortencia Pilar, MD  04/12/2022 3:47 PM

## 2022-04-13 ENCOUNTER — Ambulatory Visit (INDEPENDENT_AMBULATORY_CARE_PROVIDER_SITE_OTHER): Payer: BC Managed Care – PPO | Admitting: Vascular Surgery

## 2022-04-13 ENCOUNTER — Encounter (INDEPENDENT_AMBULATORY_CARE_PROVIDER_SITE_OTHER): Payer: Self-pay | Admitting: Vascular Surgery

## 2022-04-13 VITALS — BP 131/75 | HR 60 | Resp 17 | Ht 66.0 in | Wt 246.0 lb

## 2022-04-13 DIAGNOSIS — E7800 Pure hypercholesterolemia, unspecified: Secondary | ICD-10-CM

## 2022-04-13 DIAGNOSIS — I890 Lymphedema, not elsewhere classified: Secondary | ICD-10-CM

## 2022-04-13 DIAGNOSIS — M159 Polyosteoarthritis, unspecified: Secondary | ICD-10-CM | POA: Diagnosis not present

## 2022-04-13 DIAGNOSIS — I872 Venous insufficiency (chronic) (peripheral): Secondary | ICD-10-CM | POA: Diagnosis not present

## 2022-06-25 IMAGING — CT CT ABDOMEN W/ CM
1 of 3 series · 12 of 32 positions shown, 18 images · IV contrast (APPLIED)
Comparison: None.

CLINICAL DATA: Nausea and vomiting for 2-3 months, history of
gastric sleeve 8 years ago

EXAM:
CT ABDOMEN WITH CONTRAST
TECHNIQUE: Multidetector CT imaging of the abdomen was performed using the
standard protocol following bolus administration of intravenous
contrast.
CONTRAST:  100mL OMNIPAQUE IOHEXOL 300 MG/ML SOLN, additional oral
enteric contrast

[Series 2: axial st · axial · 0.72mm/px · z∈[-724,-504]mm · 12 of 54 slices shown, 18 images]
[im 5/54  soft-tissue]
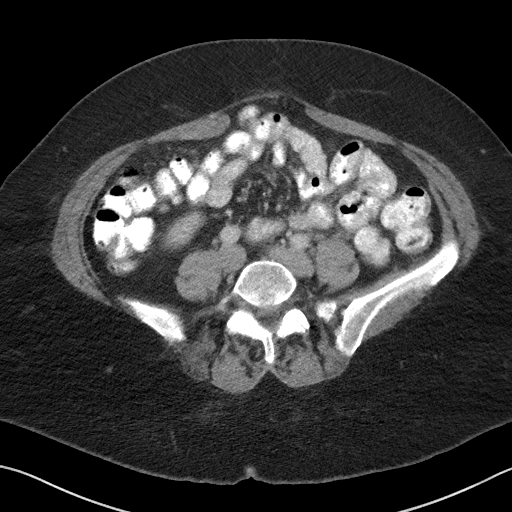
[im 5/54  bone]
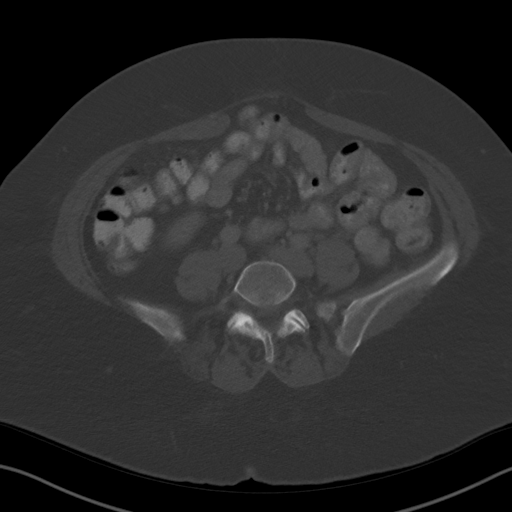
[im 9/54  soft-tissue]
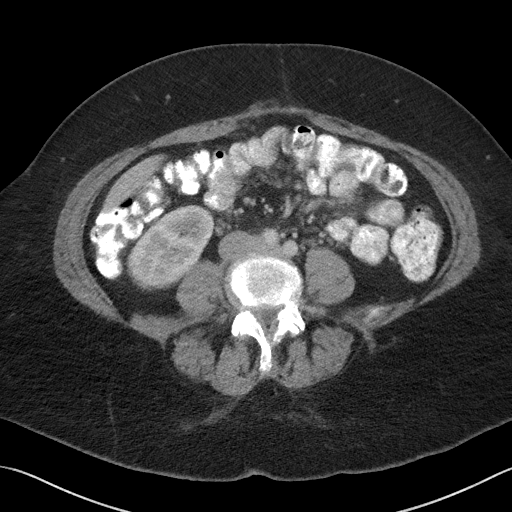
[im 13/54  soft-tissue]
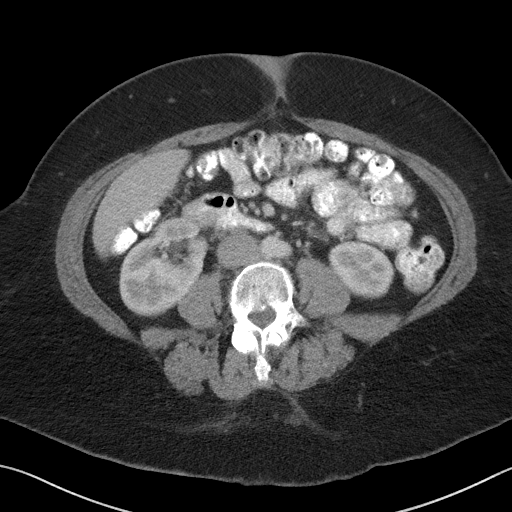
[im 17/54  soft-tissue]
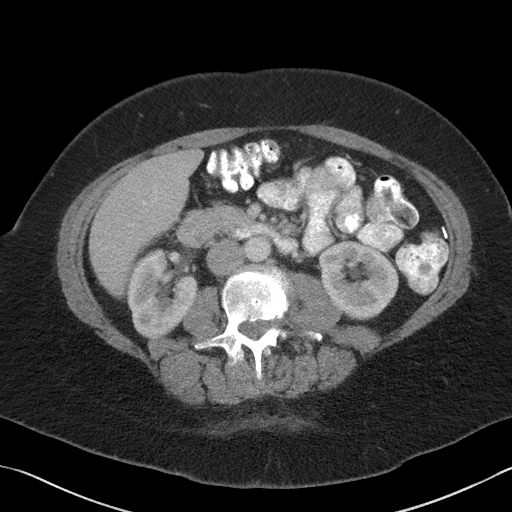
[im 21/54  soft-tissue]
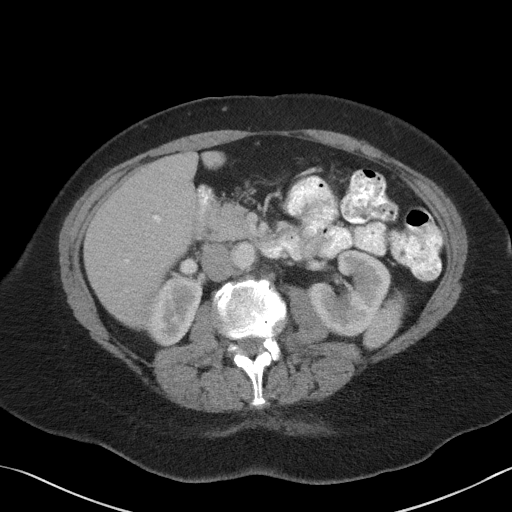
[im 25/54  soft-tissue]
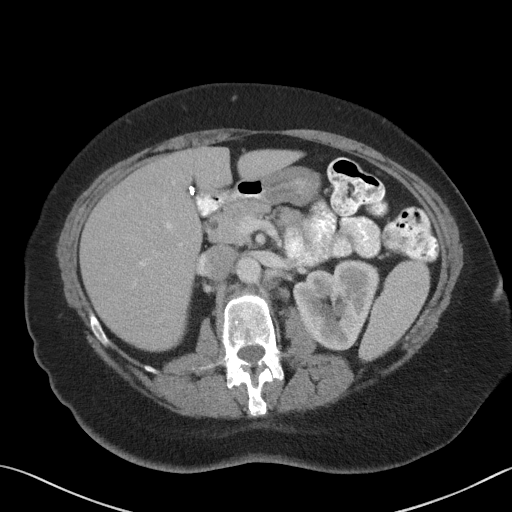
[im 29/54  soft-tissue]
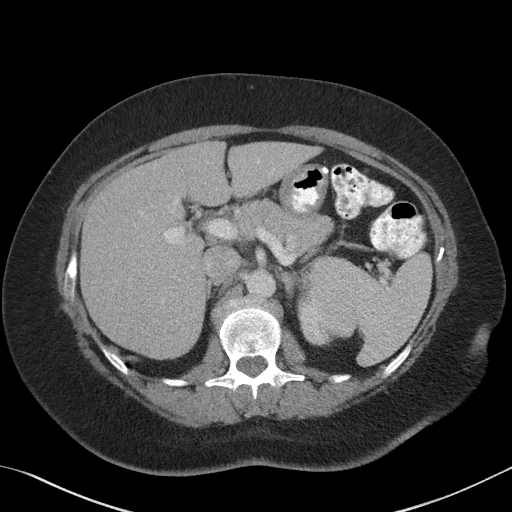
[im 33/54  soft-tissue]
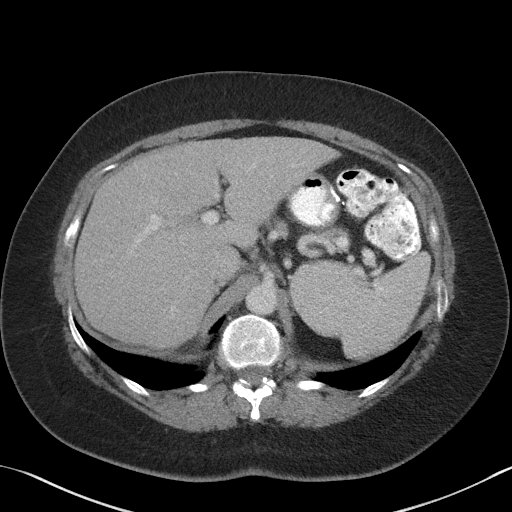
[im 37/54  soft-tissue]
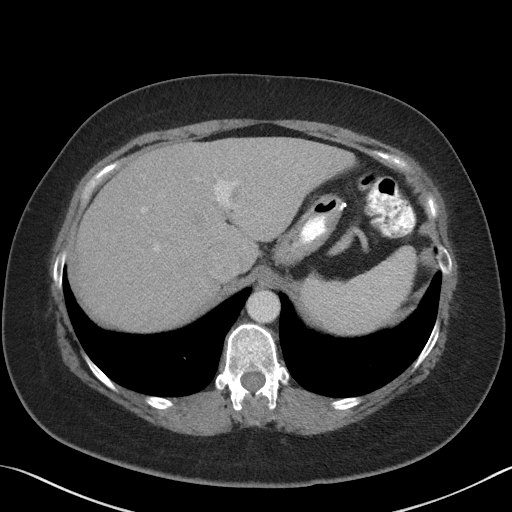
[im 37/54  lung]
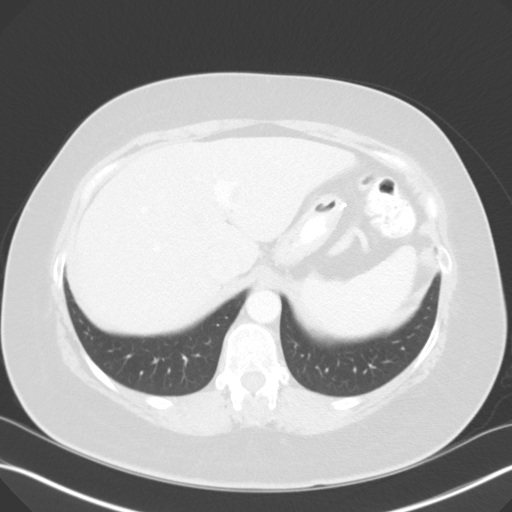
[im 37/54  bone]
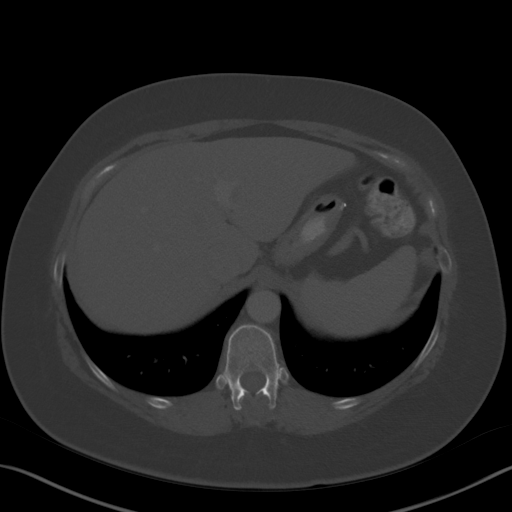
[im 41/54  soft-tissue]
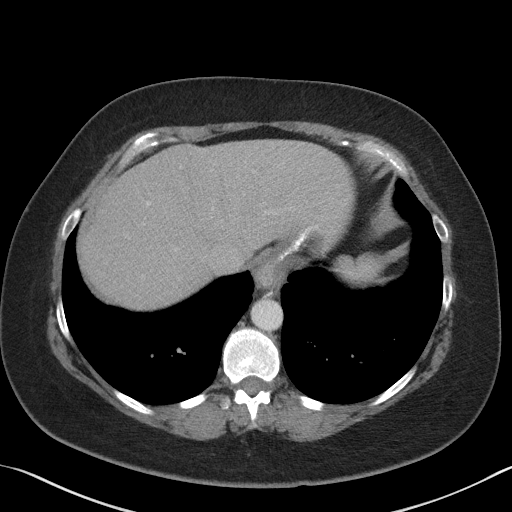
[im 41/54  lung]
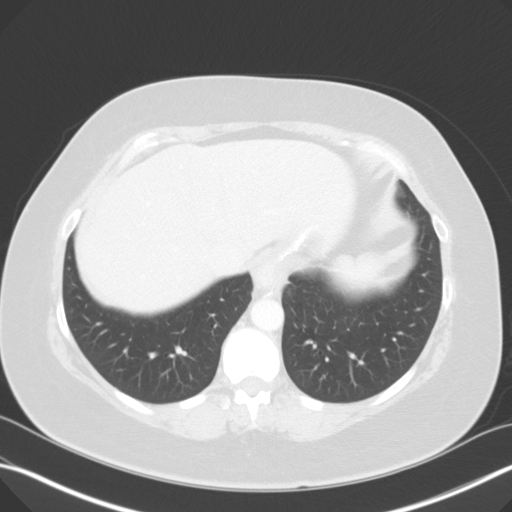
[im 45/54  soft-tissue]
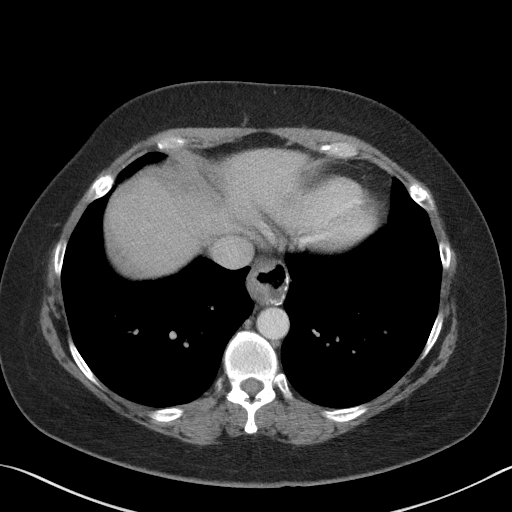
[im 45/54  lung]
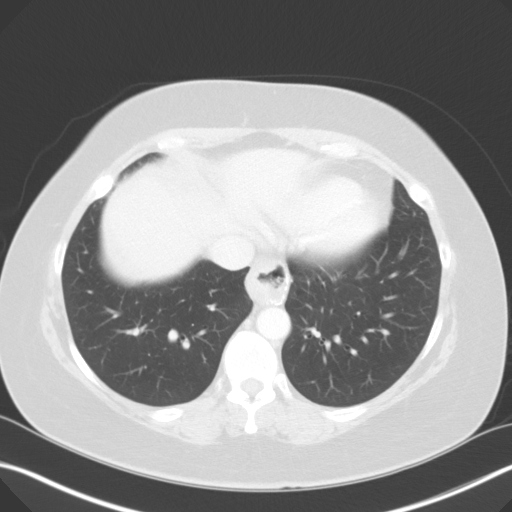
[im 49/54  soft-tissue]
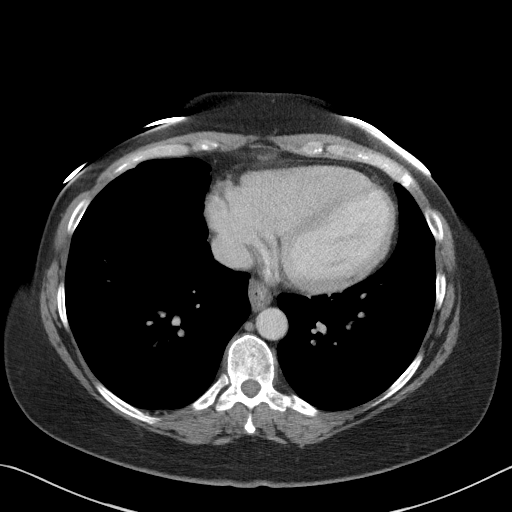
[im 49/54  lung]
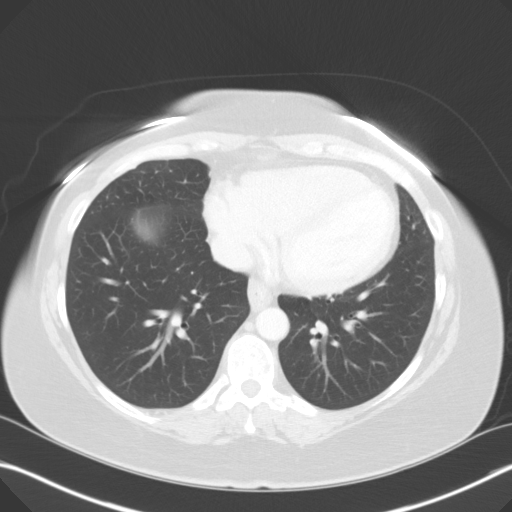

[12 of 32 positions shown; findings below may reference images not displayed]

FINDINGS: Lower chest: No acute abnormality.

Hepatobiliary: No focal liver abnormality is seen. Status post
cholecystectomy. No biliary dilatation.

Pancreas: Unremarkable. No pancreatic ductal dilatation or
surrounding inflammatory changes.

Spleen: Normal in size without focal abnormality.

Adrenals/Urinary Tract: Adrenal glands are unremarkable. Kidneys are
normal, without renal calculi, focal lesion, or hydronephrosis.
Bladder is unremarkable.

Stomach/Bowel: Small hiatal hernia. Status post partial sleeve
gastrectomy. No evidence of bowel wall thickening, distention, or
inflammatory changes.

Vascular/Lymphatic: No significant vascular findings are present. No
enlarged abdominal or pelvic lymph nodes.

Other: Partially imaged diastasis rectus. No abdominopelvic ascites.

Musculoskeletal: No acute or significant osseous findings.
IMPRESSION: 1. No acute CT findings of the abdomen to explain nausea, vomiting,
or vomiting.
2. Status post partial sleeve gastrectomy and cholecystectomy.
3. Small hiatal hernia.
4. Partially imaged diastasis rectus.

## 2022-06-29 ENCOUNTER — Other Ambulatory Visit: Payer: Self-pay | Admitting: Gerontology

## 2022-06-29 DIAGNOSIS — Z1231 Encounter for screening mammogram for malignant neoplasm of breast: Secondary | ICD-10-CM

## 2022-07-27 ENCOUNTER — Encounter (INDEPENDENT_AMBULATORY_CARE_PROVIDER_SITE_OTHER): Payer: Self-pay

## 2022-08-03 ENCOUNTER — Ambulatory Visit: Payer: BC Managed Care – PPO | Admitting: Anesthesiology

## 2022-08-03 ENCOUNTER — Encounter: Payer: Self-pay | Admitting: *Deleted

## 2022-08-03 ENCOUNTER — Encounter
Admission: RE | Disposition: A | Discharge status: Home / Self Care | Payer: Self-pay | Source: Home / Self Care | Attending: Gastroenterology

## 2022-08-03 ENCOUNTER — Ambulatory Visit
Admission: RE | Admit: 2022-08-03 | Discharge: 2022-08-03 | Disposition: A | Discharge status: Home / Self Care | Payer: BC Managed Care – PPO | Attending: Gastroenterology | Admitting: Gastroenterology

## 2022-08-03 DIAGNOSIS — Z9884 Bariatric surgery status: Secondary | ICD-10-CM | POA: Insufficient documentation

## 2022-08-03 DIAGNOSIS — E7800 Pure hypercholesterolemia, unspecified: Secondary | ICD-10-CM | POA: Diagnosis not present

## 2022-08-03 DIAGNOSIS — Z1211 Encounter for screening for malignant neoplasm of colon: Secondary | ICD-10-CM | POA: Diagnosis present

## 2022-08-03 DIAGNOSIS — K219 Gastro-esophageal reflux disease without esophagitis: Secondary | ICD-10-CM | POA: Insufficient documentation

## 2022-08-03 DIAGNOSIS — K640 First degree hemorrhoids: Secondary | ICD-10-CM | POA: Diagnosis not present

## 2022-08-03 DIAGNOSIS — R011 Cardiac murmur, unspecified: Secondary | ICD-10-CM | POA: Diagnosis not present

## 2022-08-03 DIAGNOSIS — K449 Diaphragmatic hernia without obstruction or gangrene: Secondary | ICD-10-CM | POA: Insufficient documentation

## 2022-08-03 DIAGNOSIS — Z86010 Personal history of colonic polyps: Secondary | ICD-10-CM | POA: Diagnosis not present

## 2022-08-03 DIAGNOSIS — M1712 Unilateral primary osteoarthritis, left knee: Secondary | ICD-10-CM | POA: Diagnosis not present

## 2022-08-03 HISTORY — PX: COLONOSCOPY WITH PROPOFOL: SHX5780

## 2022-08-03 SURGERY — COLONOSCOPY WITH PROPOFOL
Anesthesia: General

## 2022-08-03 MED ORDER — KETAMINE HCL 50 MG/5ML IJ SOSY
PREFILLED_SYRINGE | INTRAMUSCULAR | Status: AC
  Filled 2022-08-03: qty 5

## 2022-08-03 MED ORDER — LIDOCAINE HCL (PF) 2 % IJ SOLN
INTRAMUSCULAR | Status: AC
  Filled 2022-08-03: qty 10

## 2022-08-03 MED ORDER — PROPOFOL 500 MG/50ML IV EMUL
INTRAVENOUS | Status: DC | PRN
  Administered 2022-08-03: 140 ug/kg/min via INTRAVENOUS

## 2022-08-03 MED ORDER — SODIUM CHLORIDE 0.9 % IV SOLN
INTRAVENOUS | Status: DC
  Administered 2022-08-03: 1000 mL via INTRAVENOUS

## 2022-08-03 MED ORDER — MIDAZOLAM HCL 2 MG/2ML IJ SOLN
INTRAMUSCULAR | Status: DC | PRN
  Administered 2022-08-03: 2 mg via INTRAVENOUS

## 2022-08-03 MED ORDER — MIDAZOLAM HCL 2 MG/2ML IJ SOLN
INTRAMUSCULAR | Status: AC
  Filled 2022-08-03: qty 2

## 2022-08-03 MED ORDER — PHENYLEPHRINE 80 MCG/ML (10ML) SYRINGE FOR IV PUSH (FOR BLOOD PRESSURE SUPPORT)
PREFILLED_SYRINGE | INTRAVENOUS | Status: AC
  Filled 2022-08-03: qty 40

## 2022-08-03 MED ORDER — LIDOCAINE HCL (CARDIAC) PF 100 MG/5ML IV SOSY
PREFILLED_SYRINGE | INTRAVENOUS | Status: DC | PRN
  Administered 2022-08-03: 50 mg via INTRAVENOUS

## 2022-08-03 MED ORDER — KETAMINE HCL 10 MG/ML IJ SOLN
INTRAMUSCULAR | Status: DC | PRN
  Administered 2022-08-03: 15 mg via INTRAVENOUS

## 2022-08-03 MED ORDER — PROPOFOL 10 MG/ML IV BOLUS
INTRAVENOUS | Status: DC | PRN
  Administered 2022-08-03: 50 mg via INTRAVENOUS

## 2022-08-03 NOTE — Interval H&P Note (Signed)
History and Physical Interval Note:  08/03/2022 12:52 PM  Shari Parks  has presented today for surgery, with the diagnosis of history adenomatous polyps.  The various methods of treatment have been discussed with the patient and family. After consideration of risks, benefits and other options for treatment, the patient has consented to  Procedure(s): COLONOSCOPY WITH PROPOFOL (N/A) as a surgical intervention.  The patient's history has been reviewed, patient examined, no change in status, stable for surgery.  I have reviewed the patient's chart and labs.  Questions were answered to the patient's satisfaction.     Lesly Rubenstein  Ok to proceed with colonoscopy

## 2022-08-03 NOTE — H&P (Signed)
Outpatient short stay form Pre-procedure 08/03/2022  Lesly Rubenstein, MD  Primary Physician: Latanya Maudlin, NP  Reason for visit:  Surveillance  History of present illness:    56 y/o lady here for colonoscopy for history of polyps. Last colonoscopy 5-6 years ago was normal. No blood thinners. No family history of GI malignancies. History of sleeve gastrectomy and cholecystectomy.    Current Facility-Administered Medications:    0.9 %  sodium chloride infusion, , Intravenous, Continuous, Aynslee Mulhall, Hilton Cork, MD, Last Rate: 20 mL/hr at 08/03/22 1250, Continued from Pre-op at 08/03/22 1250  Medications Prior to Admission  Medication Sig Dispense Refill Last Dose   Alpha-Lipoic Acid 600 MG CAPS Take by mouth.   Past Week   aspirin 81 MG tablet Take 81 mg by mouth daily.   Past Week   cholecalciferol (VITAMIN D3) 25 MCG (1000 UNIT) tablet Take 1,000 Units by mouth daily.   Past Week   Melatonin 10 MG TABS Take 10 mg by mouth at bedtime.   Past Week   Multiple Vitamin (MULTIVITAMIN) capsule Take 1 capsule by mouth daily.   Past Week   Red Yeast Rice Extract (RED YEAST RICE PO) Take by mouth.   Past Week   vitamin B-12 (CYANOCOBALAMIN) 100 MCG tablet Take 100 mcg by mouth daily.   Past Week     Allergies  Allergen Reactions   Sulfa Antibiotics Swelling, Anaphylaxis and Rash    Tongue, high fever Other reaction(s): Other (See Comments), Other (See Comments) Fever and tongue swells Fever and tongue swells  Other reaction(s): Other (See Comments) Fever and tongue swells Tongue, high fever Other reaction(s): Other (See Comments), Other (See Comments) Fever and tongue swells Fever and tongue swells   Sulfasalazine Swelling    Tongue, high fever   Tetanus Toxoids Anaphylaxis and Other (See Comments)    High fever     Past Medical History:  Diagnosis Date   Arthritis    left knee   Heart murmur    "slight" - followed by PCP   History of hiatal hernia     Hypercholesterolemia    Lymphedema    Stroke ([place])    Occlusion of left posterior cerebral artery   Varicose vein of leg    Wears contact lenses     Review of systems:  Otherwise negative.    Physical Exam  Gen: Alert, oriented. Appears stated age.  HEENT: PERRLA. Lungs: No respiratory distress CV: RRR Abd: soft, benign, no masses Ext: No edema    Planned procedures: Proceed with colonoscopy. The patient understands the nature of the planned procedure, indications, risks, alternatives and potential complications including but not limited to bleeding, infection, perforation, damage to internal organs and possible oversedation/side effects from anesthesia. The patient agrees and gives consent to proceed.  Please refer to procedure notes for findings, recommendations and patient disposition/instructions.     Lesly Rubenstein, MD [facility]

## 2022-08-03 NOTE — Anesthesia Preprocedure Evaluation (Signed)
Anesthesia Evaluation  Patient identified by MRN, date of birth, ID band Patient awake    Reviewed: Allergy & Precautions, NPO status , Patient's Chart, lab work & pertinent test results  History of Anesthesia Complications Negative for: history of anesthetic complications  Airway Mallampati: III  TM Distance: <3 FB Neck ROM: full    Dental  (+) Chipped, Poor Dentition, Missing, Caps   Pulmonary neg pulmonary ROS, neg shortness of breath   Pulmonary exam normal        Cardiovascular Exercise Tolerance: Good Normal cardiovascular exam+ Valvular Problems/Murmurs      Neuro/Psych  Neuromuscular disease CVA, Residual Symptoms  negative psych ROS   GI/Hepatic Neg liver ROS, hiatal hernia,GERD  Controlled,,  Endo/Other  negative endocrine ROS    Renal/GU negative Renal ROS  negative genitourinary   Musculoskeletal   Abdominal   Peds  Hematology negative hematology ROS (+)   Anesthesia Other Findings Past Medical History: No date: Arthritis     Comment:  left knee No date: Heart murmur     Comment:  "slight" - followed by PCP No date: History of hiatal hernia No date: Hypercholesterolemia No date: Lymphedema No date: Stroke [facility])     Comment:  Occlusion of left posterior cerebral artery No date: Varicose vein of leg No date: Wears contact lenses  Past Surgical History: No date: ABDOMINAL HYSTERECTOMY 6/92: CHOLECYSTECTOMY 04/30/14: COLONOSCOPY No date: DIAGNOSTIC LAPAROSCOPY 2012: ESOPHAGOGASTRIC FUNDOPLASTY 08/06/2020: ESOPHAGOGASTRODUODENOSCOPY (EGD) WITH PROPOFOL; N/A     Comment:  Procedure: ESOPHAGOGASTRODUODENOSCOPY (EGD) WITH               PROPOFOL;  Surgeon: Lesly Rubenstein, MD;  Location:               [facility] END[facility]COPY;  Service: Endoscopy;  Laterality: N/A; 04/05/13: HAMMER TOE SURGERY; Right 11/22/2015: KNEE ARTHROSCOPY; Left     Comment:  Procedure: ARTHROSCOPY KNEE WITH CHONDROPLASTY MEDIAL                FEMORAL CONDYLE;  Surgeon: Leanor Kail, MD;                Location: [facility];  Service: Orthopedics;                Laterality: Left;  order of cases moved per CeCe and Dr.               Leslye Peer 12/2010: LAPAROSCOPIC GASTRIC SLEEVE RESECTION approx 2011: NOVASURE ABLATION 6/84: TONSILLECTOMY     Reproductive/Obstetrics negative OB ROS                             Anesthesia Physical Anesthesia Plan  ASA: 3  Anesthesia Plan: General   Post-op Pain Management:    Induction: Intravenous  PONV Risk Score and Plan: Propofol infusion and TIVA  Airway Management Planned: Natural Airway and Nasal Cannula  Additional Equipment:   Intra-op Plan:   Post-operative Plan:   Informed Consent: I have reviewed the patients History and Physical, chart, labs and discussed the procedure including the risks, benefits and alternatives for the proposed anesthesia with the patient or authorized representative who has indicated his/her understanding and acceptance.     Dental Advisory Given  Plan Discussed with: Anesthesiologist, CRNA and Surgeon  Anesthesia Plan Comments: (Patient consented for risks of anesthesia including but not limited to:  - adverse reactions to medications - risk of airway placement if required - damage to eyes, teeth,  lips or other oral mucosa - nerve damage due to positioning  - sore throat or hoarseness - Damage to heart, brain, nerves, lungs, other parts of body or loss of life  Patient voiced understanding.)       Anesthesia Quick Evaluation

## 2022-08-03 NOTE — Anesthesia Postprocedure Evaluation (Signed)
Anesthesia Post Note  Patient: See Beharry  Procedure(s) Performed: COLONOSCOPY WITH PROPOFOL  Patient location during evaluation: Endoscopy Anesthesia Type: General Level of consciousness: awake and alert Pain management: pain level controlled Vital Signs Assessment: post-procedure vital signs reviewed and stable Respiratory status: spontaneous breathing, nonlabored ventilation, respiratory function stable and patient connected to nasal cannula oxygen Cardiovascular status: blood pressure returned to baseline and stable Postop Assessment: no apparent nausea or vomiting Anesthetic complications: no   No notable events documented.   Last Vitals:  Vitals:   08/03/22 1326 08/03/22 1336  BP: (!) 103/55 123/71  Pulse: (!) 58 60  Resp: 15 15  Temp:    SpO2: 100% 100%    Last Pain:  Vitals:   08/03/22 1336  TempSrc:   PainSc: 0-No pain                 Arita Miss

## 2022-08-03 NOTE — Op Note (Signed)
[facility] Patient Name: Shari Parks Procedure Date: 08/03/2022 12:45 PM MRN: [id number] Account #: [id number] Date of Birth: 1965-11-05 Admit Type: Outpatient Age: 56 Room: [facility] ENDO ROOM 3 Gender: Female Note Status: Finalized Instrument Name: Park Meo [id number] Procedure:             Colonoscopy Indications:           Surveillance: Personal history of colonic polyps                         (unknown histology) on last colonoscopy more than 5                         years ago Providers:             Andrey Farmer MD, MD Referring MD:          No Local Md, MD (Referring MD) Medicines:             Monitored Anesthesia Care Complications:         No immediate complications. Procedure:             Pre-Anesthesia Assessment:                        - Prior to the procedure, a History and Physical was                         performed, and patient medications and allergies were                         reviewed. The patient is competent. The risks and                         benefits of the procedure and the sedation options and                         risks were discussed with the patient. All questions                         were answered and informed consent was obtained.                         Patient identification and proposed procedure were                         verified by the physician, the nurse, the                         anesthesiologist, the anesthetist and the technician                         in the endoscopy suite. Mental Status Examination:                         alert and oriented. Airway Examination: normal                         oropharyngeal airway and neck mobility. Respiratory  Examination: clear to auscultation. CV Examination:                         normal. Prophylactic Antibiotics: The patient does not                         require prophylactic antibiotics. Prior                          Anticoagulants: The patient has taken no anticoagulant                         or antiplatelet agents. ASA Grade Assessment: III - A                         patient with severe systemic disease. After reviewing                         the risks and benefits, the patient was deemed in                         satisfactory condition to undergo the procedure. The                         anesthesia plan was to use monitored anesthesia care                         (MAC). Immediately prior to administration of                         medications, the patient was re-assessed for adequacy                         to receive sedatives. The heart rate, respiratory                         rate, oxygen saturations, blood pressure, adequacy of                         pulmonary ventilation, and response to care were                         monitored throughout the procedure. The physical                         status of the patient was re-assessed after the                         procedure.                        After obtaining informed consent, the colonoscope was                         passed under direct vision. Throughout the procedure,                         the patient's blood pressure, pulse, and oxygen  saturations were monitored continuously. The                         Colonoscope was introduced through the anus and                         advanced to the the cecum, identified by appendiceal                         orifice and ileocecal valve. The colonoscopy was                         performed without difficulty. The patient tolerated                         the procedure well. The quality of the bowel                         preparation was good except the cecum was fair. The                         ileocecal valve, appendiceal orifice, and rectum were                         photographed. Findings:      The perianal and digital rectal examinations were normal.       Internal hemorrhoids were found during retroflexion. The hemorrhoids       were Grade I (internal hemorrhoids that do not prolapse).      A small amount of semi-solid stool was found at the appendiceal orifice,       making visualization difficult. Lavage of the area was performed using a       large amount, resulting in clearance with adequate visualization. Impression:            - Internal hemorrhoids.                        - Stool at the appendiceal orifice. Adequate                         visualization was obtained after extensive washing.                        - No specimens collected. Recommendation:        - Discharge patient to home.                        - Resume previous diet.                        - Continue present medications.                        - Repeat colonoscopy in 5 years for surveillance.                        - Return to referring physician as previously                         scheduled. Procedure Code(s):     ---  Professional ---                        R8309, Colorectal cancer screening; colonoscopy on                         individual at high risk Diagnosis Code(s):     --- Professional ---                        Z86.010, Personal history of colonic polyps                        K64.0, First degree hemorrhoids CPT copyright 2022 American [employer] Association. All rights reserved. The codes documented in this report are preliminary and upon coder review may  be revised to meet current compliance requirements. Andrey Farmer MD, MD 08/03/2022 1:20:39 PM Number of Addenda: 0 Note Initiated On: 08/03/2022 12:45 PM Scope Withdrawal Time: 0 hours 7 minutes 26 seconds  Total Procedure Duration: 0 hours 14 minutes 5 seconds  Estimated Blood Loss:  Estimated blood loss: none.      [facility]

## 2022-08-03 NOTE — Transfer of Care (Signed)
Immediate Anesthesia Transfer of Care Note  Patient: Shari Parks  Procedure(s) Performed: COLONOSCOPY WITH PROPOFOL  Patient Location: PACU and Endoscopy Unit  Anesthesia Type:General  Level of Consciousness: drowsy  Airway & Oxygen Therapy: Patient Spontanous Breathing  Post-op Assessment: Report given to RN and Post -op Vital signs reviewed and stable  Post vital signs: Reviewed and stable  Last Vitals:  Vitals Value Taken Time  BP    Temp    Pulse 59 08/03/22 1315  Resp 14 08/03/22 1315  SpO2 100 % 08/03/22 1315  Vitals shown include unvalidated device data.  Last Pain:  Vitals:   08/03/22 1147  TempSrc: Temporal  PainSc: 0-No pain         Complications: No notable events documented.

## 2022-08-04 ENCOUNTER — Encounter: Payer: Self-pay | Admitting: Gastroenterology

## 2022-08-13 ENCOUNTER — Ambulatory Visit
Admission: RE | Admit: 2022-08-13 | Discharge: 2022-08-13 | Disposition: A | Discharge status: Home / Self Care | Payer: BC Managed Care – PPO | Source: Ambulatory Visit | Attending: Gerontology | Admitting: Gerontology

## 2022-08-13 DIAGNOSIS — Z1231 Encounter for screening mammogram for malignant neoplasm of breast: Secondary | ICD-10-CM | POA: Diagnosis present

## 2023-07-02 ENCOUNTER — Other Ambulatory Visit: Payer: Self-pay | Admitting: Gerontology

## 2023-07-02 DIAGNOSIS — Z1231 Encounter for screening mammogram for malignant neoplasm of breast: Secondary | ICD-10-CM

## 2023-07-16 ENCOUNTER — Other Ambulatory Visit: Payer: Self-pay | Admitting: Gerontology

## 2023-07-16 DIAGNOSIS — Z87898 Personal history of other specified conditions: Secondary | ICD-10-CM

## 2023-08-16 ENCOUNTER — Ambulatory Visit
Admission: RE | Admit: 2023-08-16 | Discharge: 2023-08-16 | Disposition: A | Discharge status: Home / Self Care | Payer: BC Managed Care – PPO | Source: Ambulatory Visit | Attending: Gerontology | Admitting: Gerontology

## 2023-08-16 DIAGNOSIS — Z1231 Encounter for screening mammogram for malignant neoplasm of breast: Secondary | ICD-10-CM | POA: Insufficient documentation

## 2024-03-09 ENCOUNTER — Other Ambulatory Visit: Payer: Self-pay | Admitting: Podiatry

## 2024-03-09 DIAGNOSIS — M19072 Primary osteoarthritis, left ankle and foot: Secondary | ICD-10-CM

## 2024-03-09 DIAGNOSIS — M76811 Anterior tibial syndrome, right leg: Secondary | ICD-10-CM

## 2024-03-10 ENCOUNTER — Encounter: Payer: Self-pay | Admitting: Podiatry

## 2024-03-11 ENCOUNTER — Ambulatory Visit
Admission: RE | Admit: 2024-03-11 | Discharge: 2024-03-11 | Disposition: A | Discharge status: Home / Self Care | Source: Ambulatory Visit | Attending: Podiatry | Admitting: Podiatry

## 2024-03-11 DIAGNOSIS — M76811 Anterior tibial syndrome, right leg: Secondary | ICD-10-CM

## 2024-03-11 DIAGNOSIS — M19072 Primary osteoarthritis, left ankle and foot: Secondary | ICD-10-CM

## 2024-05-09 ENCOUNTER — Other Ambulatory Visit: Payer: Self-pay | Admitting: Medical Genetics

## 2024-05-11 ENCOUNTER — Other Ambulatory Visit
Admission: RE | Admit: 2024-05-11 | Discharge: 2024-05-11 | Disposition: A | Discharge status: Home / Self Care | Payer: Self-pay | Source: Ambulatory Visit | Attending: Medical Genetics | Admitting: Medical Genetics

## 2024-05-22 LAB — GENECONNECT MOLECULAR SCREEN: Genetic Analysis Overall Interpretation: NEGATIVE

## 2024-06-13 ENCOUNTER — Other Ambulatory Visit: Payer: Self-pay | Admitting: Podiatry

## 2024-06-16 ENCOUNTER — Other Ambulatory Visit: Payer: Self-pay | Admitting: Orthopedic Surgery

## 2024-06-16 ENCOUNTER — Other Ambulatory Visit

## 2024-06-16 DIAGNOSIS — M2391 Unspecified internal derangement of right knee: Secondary | ICD-10-CM

## 2024-06-16 DIAGNOSIS — M1711 Unilateral primary osteoarthritis, right knee: Secondary | ICD-10-CM

## 2024-06-19 ENCOUNTER — Encounter: Payer: Self-pay | Admitting: Orthopedic Surgery

## 2024-06-21 ENCOUNTER — Ambulatory Visit
Admission: RE | Admit: 2024-06-21 | Discharge: 2024-06-21 | Disposition: A | Discharge status: Home / Self Care | Attending: Gastroenterology | Admitting: Gastroenterology

## 2024-06-21 ENCOUNTER — Ambulatory Visit: Admitting: Anesthesiology

## 2024-06-21 ENCOUNTER — Encounter: Payer: Self-pay | Admitting: Gastroenterology

## 2024-06-21 ENCOUNTER — Encounter
Admission: RE | Disposition: A | Discharge status: Home / Self Care | Payer: Self-pay | Source: Home / Self Care | Attending: Gastroenterology

## 2024-06-21 DIAGNOSIS — K219 Gastro-esophageal reflux disease without esophagitis: Secondary | ICD-10-CM | POA: Insufficient documentation

## 2024-06-21 DIAGNOSIS — G709 Myoneural disorder, unspecified: Secondary | ICD-10-CM | POA: Insufficient documentation

## 2024-06-21 DIAGNOSIS — Z8673 Personal history of transient ischemic attack (TIA), and cerebral infarction without residual deficits: Secondary | ICD-10-CM | POA: Diagnosis not present

## 2024-06-21 DIAGNOSIS — R1310 Dysphagia, unspecified: Secondary | ICD-10-CM | POA: Diagnosis present

## 2024-06-21 DIAGNOSIS — K449 Diaphragmatic hernia without obstruction or gangrene: Secondary | ICD-10-CM | POA: Insufficient documentation

## 2024-06-21 SURGERY — EGD (ESOPHAGOGASTRODUODENOSCOPY)
Anesthesia: General

## 2024-06-21 MED ORDER — DEXMEDETOMIDINE HCL IN NACL 80 MCG/20ML IV SOLN
INTRAVENOUS | Status: DC | PRN
  Administered 2024-06-21: 8 ug via INTRAVENOUS

## 2024-06-21 MED ORDER — LIDOCAINE HCL (CARDIAC) PF 100 MG/5ML IV SOSY
PREFILLED_SYRINGE | INTRAVENOUS | Status: DC | PRN
  Administered 2024-06-21: 60 mg via INTRAVENOUS

## 2024-06-21 MED ORDER — SODIUM CHLORIDE 0.9 % IV SOLN
INTRAVENOUS | Status: DC
Start: 2024-06-21 — End: 2024-06-21

## 2024-06-21 MED ORDER — PROPOFOL 10 MG/ML IV BOLUS
INTRAVENOUS | Status: DC | PRN
  Administered 2024-06-21 (×2): 30 mg via INTRAVENOUS
  Administered 2024-06-21: 70 mg via INTRAVENOUS

## 2024-06-21 NOTE — H&P (Signed)
 Ruel Kung , MD [address], [place], [place], [address] Phone: [phone] Fax: [phone]  Primary Care Physician:  Steva Clotilda DEL, NP   Pre-Procedure History & Physical: HPI:  Shari Parks is a 58 y.o. female is here for an endoscopy    Past Medical History:  Diagnosis Date   Arthritis    left knee   Heart murmur    slight - followed by PCP   History of hiatal hernia    Hypercholesterolemia    Lymphedema    Stroke [facility])    Occlusion of left posterior cerebral artery   Varicose vein of leg    Wears contact lenses     Past Surgical History:  Procedure Laterality Date   ABDOMINAL HYSTERECTOMY     CHOLECYSTECTOMY  6/92   COLONOSCOPY  04/30/14   COLONOSCOPY WITH PROPOFOL  N/A 08/03/2022   Procedure: COLONOSCOPY WITH PROPOFOL ;  Surgeon: Maryruth Ole DASEN, MD;  Location: [facility] ENDOSCOPY;  Service: Endoscopy;  Laterality: N/A;   DIAGNOSTIC LAPAROSCOPY     ESOPHAGOGASTRIC FUNDOPLASTY  2012   ESOPHAGOGASTRODUODENOSCOPY (EGD) WITH PROPOFOL  N/A 08/06/2020   Procedure: ESOPHAGOGASTRODUODENOSCOPY (EGD) WITH PROPOFOL ;  Surgeon: Maryruth Ole DASEN, MD;  Location: [facility] ENDOSCOPY;  Service: Endoscopy;  Laterality: N/A;   HAMMER TOE SURGERY Right 04/05/13   KNEE ARTHROSCOPY Left 11/22/2015   Procedure: ARTHROSCOPY KNEE WITH CHONDROPLASTY MEDIAL FEMORAL CONDYLE;  Surgeon: Helayne Glenn, MD;  Location: [facility];  Service: Orthopedics;  Laterality: Left;  order of cases moved per CeCe and Dr. Bianca   LAPAROSCOPIC GASTRIC SLEEVE RESECTION  12/2010   NOVASURE ABLATION  approx 2011   TONSILLECTOMY  6/84    Prior to Admission medications   Medication Sig Start Date End Date Taking? Authorizing Provider  Alpha-Lipoic Acid 600 MG CAPS Take by mouth.    [provider]  aspirin 81 MG tablet Take 81 mg by mouth daily.    [provider]  cholecalciferol (VITAMIN D3) 25 MCG (1000 UNIT) tablet Take 1,000 Units by mouth daily.    [provider]  Melatonin 10 MG TABS Take 10 mg by mouth at bedtime.    [provider]  Multiple Vitamin (MULTIVITAMIN) capsule Take 1 capsule by mouth daily.    [provider]  Red Yeast Rice Extract (RED YEAST RICE PO) Take by mouth.    [provider]  vitamin B-12 (CYANOCOBALAMIN) 100 MCG tablet Take 100 mcg by mouth daily.    [provider]    Allergies as of 06/15/2024 - Review Complete 08/03/2022  Allergen Reaction Noted   Sulfa antibiotics Swelling, Anaphylaxis, and Rash 11/08/2012   Sulfasalazine Swelling 11/12/2015   Tetanus toxoid-containing vaccines Anaphylaxis and Other (See Comments) 11/12/2015    Family History  Problem Relation Age of Onset   Breast cancer Sister 63   Breast cancer Niece 38    Social History   Socioeconomic History   Marital status: Married    Spouse name: Not on file   Number of children: Not on file   Years of education: Not on file   Highest education level: Not on file  Occupational History   Not on file  Tobacco Use   Smoking status: Never   Smokeless tobacco: Never  Vaping Use   Vaping status: Never Used  Substance and Sexual Activity   Alcohol use: Yes    Alcohol/week: 2.0 - 4.0 standard drinks of alcohol    Types: 2 - 4 Glasses of wine  per week   Drug use: Never   Sexual activity: Not on file  Other Topics Concern   Not on file  Social History Narrative   Not on file   Social Drivers of Health   Financial Resource Strain: Low Risk  (03/06/2024)   Received from [facility]   Overall Financial Resource Strain (CARDIA)    Difficulty of Paying Living Expenses: Not hard at all  Food Insecurity: No Food Insecurity (03/06/2024)   Received from [facility]   Hunger Vital Sign    Within the past 12 months, you worried that your food would run out before you got the money to buy more.: Never true    Within the past 12 months, the food you bought just didn't  last and you didn't have money to get more.: Never true  Transportation Needs: No Transportation Needs (03/06/2024)   Received from [facility] - Transportation    In the past 12 months, has lack of transportation kept you from medical appointments or from getting medications?: No    Lack of Transportation (Non-Medical): No  Physical Activity: Not on file  Stress: Not on file  Social Connections: Not on file  Intimate Partner Violence: Not on file    Review of Systems: See HPI, otherwise negative ROS  Physical Exam: There were no vitals taken for this visit. General:   Alert,  pleasant and cooperative in NAD Head:  Normocephalic and atraumatic. Neck:  Supple; no masses or thyromegaly. Lungs:  Clear throughout to auscultation, normal respiratory effort.    Heart:  +S1, +S2, Regular rate and rhythm, No edema. Abdomen:  Soft, nontender and nondistended. Normal bowel sounds, without guarding, and without rebound.   Neurologic:  Alert and  oriented x4;  grossly normal neurologically.  Impression/Plan: Shari Parks is here for an endoscopy  to be performed for  evaluation of GERD/Dysphagia.     Risks, benefits, limitations, and alternatives regarding endoscopy have been reviewed with the patient.  Questions have been answered.  All parties agreeable.   Ruel Kung, MD  06/21/2024, 9:11 AM

## 2024-06-21 NOTE — Op Note (Signed)
 [facility] Gastroenterology Patient Name: Shari Parks Procedure Date: 06/21/2024 9:12 AM MRN: [id number] Account #: [id number] Date of Birth: November 06, 1965 Admit Type: Outpatient Age: 57 Room: [facility] ENDO ROOM 3 Gender: Female Note Status: Finalized Instrument Name: Upper GI Scope [id number] Procedure:             Upper GI endoscopy Indications:           Dysphagia, Follow-up of gastro-esophageal reflux                         disease Providers:             Ruel Kung MD, MD Referring MD:          No Local Md, MD (Referring MD) Medicines:             Monitored Anesthesia Care Complications:         No immediate complications. Procedure:             Pre-Anesthesia Assessment:                        - Prior to the procedure, a History and Physical was                         performed, and patient medications, allergies and                         sensitivities were reviewed. The patient's tolerance                         of previous anesthesia was reviewed.                        - The risks and benefits of the procedure and the                         sedation options and risks were discussed with the                         patient. All questions were answered and informed                         consent was obtained.                        - ASA Grade Assessment: II - A patient with mild                         systemic disease.                        After obtaining informed consent, the endoscope was                         passed under direct vision. Throughout the procedure,                         the patient's blood pressure, pulse, and oxygen                         saturations were  monitored continuously. The Endoscope                         was introduced through the mouth, and advanced to the                         third part of duodenum. The upper GI endoscopy was                         accomplished with ease. The patient tolerated the                          procedure well. Findings:      The examined esophagus was normal. Biopsies were taken with a cold       forceps for histology.      A large hiatal hernia was present.      The gastroesophageal flap valve was visualized endoscopically and       classified as Hill Grade IV (no fold, wide open lumen, hiatal hernia       present).      The examined duodenum was normal. Impression:            - Normal esophagus. Biopsied.                        - Large hiatal hernia.                        - Gastroesophageal flap valve classified as Hill Grade                         IV (no fold, wide open lumen, hiatal hernia present).                        - Normal examined duodenum. Recommendation:        - Await pathology results.                        - Discharge patient to home (with escort).                        - Resume previous diet.                        - Continue present medications.                        - Return to GI office as previously scheduled. Procedure Code(s):     --- Professional ---                        [phone], Esophagogastroduodenoscopy, flexible,                         transoral; with biopsy, single or multiple Diagnosis Code(s):     --- Professional ---                        K44.9, Diaphragmatic hernia without obstruction or  gangrene                        R13.10, Dysphagia, unspecified                        K21.9, Gastro-esophageal reflux disease without                         esophagitis CPT copyright 2022 American Medical Association. All rights reserved. The codes documented in this report are preliminary and upon coder review may  be revised to meet current compliance requirements. Ruel Kung, MD Ruel Kung MD, MD 06/21/2024 9:52:48 AM This report has been signed electronically. Number of Addenda: 0 Note Initiated On: 06/21/2024 9:12 AM Estimated Blood Loss:  Estimated blood loss: none.      [facility]

## 2024-06-21 NOTE — Anesthesia Preprocedure Evaluation (Signed)
 Anesthesia Evaluation  Patient identified by MRN, date of birth, ID band Patient awake    Reviewed: Allergy & Precautions, H&P , NPO status , Patient's Chart, lab work & pertinent test results, reviewed documented beta blocker date and time   Airway Mallampati: II   Neck ROM: full    Dental  (+) Poor Dentition   Pulmonary neg pulmonary ROS   Pulmonary exam normal        Cardiovascular Exercise Tolerance: Good Normal cardiovascular exam+ Valvular Problems/Murmurs  Rhythm:regular Rate:Normal     Neuro/Psych  Neuromuscular disease CVA  negative psych ROS   GI/Hepatic Neg liver ROS, hiatal hernia,,,  Endo/Other  negative endocrine ROS    Renal/GU negative Renal ROS  negative genitourinary   Musculoskeletal   Abdominal   Peds  Hematology negative hematology ROS (+)   Anesthesia Other Findings Past Medical History: No date: Arthritis     Comment:  left knee No date: Heart murmur     Comment:  slight - followed by PCP No date: History of hiatal hernia No date: Hypercholesterolemia No date: Lymphedema No date: Stroke [facility])     Comment:  Occlusion of left posterior cerebral artery No date: Varicose vein of leg No date: Wears contact lenses Past Surgical History: No date: ABDOMINAL HYSTERECTOMY 6/92: CHOLECYSTECTOMY 04/30/14: COLONOSCOPY 08/03/2022: COLONOSCOPY WITH PROPOFOL ; N/A     Comment:  Procedure: COLONOSCOPY WITH PROPOFOL ;  Surgeon:               Maryruth Ole DASEN, MD;  Location: [facility] ENDOSCOPY;                Service: Endoscopy;  Laterality: N/A; No date: DIAGNOSTIC LAPAROSCOPY 2012: ESOPHAGOGASTRIC FUNDOPLASTY 08/06/2020: ESOPHAGOGASTRODUODENOSCOPY (EGD) WITH PROPOFOL ; N/A     Comment:  Procedure: ESOPHAGOGASTRODUODENOSCOPY (EGD) WITH               PROPOFOL ;  Surgeon: Maryruth Ole DASEN, MD;  Location:               [facility] ENDOSCOPY;  Service: Endoscopy;  Laterality: N/A; 04/05/13: HAMMER TOE SURGERY;  Right 11/22/2015: KNEE ARTHROSCOPY; Left     Comment:  Procedure: ARTHROSCOPY KNEE WITH CHONDROPLASTY MEDIAL               FEMORAL CONDYLE;  Surgeon: Helayne Glenn, MD;                Location: [facility];  Service: Orthopedics;                Laterality: Left;  order of cases moved per CeCe and Dr.               Bianca 12/2010: LAPAROSCOPIC GASTRIC SLEEVE RESECTION approx 2011: NOVASURE ABLATION 6/84: TONSILLECTOMY   Reproductive/Obstetrics negative OB ROS                              Anesthesia Physical Anesthesia Plan  ASA: 3  Anesthesia Plan: General   Post-op Pain Management:    Induction:   PONV Risk Score and Plan:   Airway Management Planned:   Additional Equipment:   Intra-op Plan:   Post-operative Plan:   Informed Consent: I have reviewed the patients History and Physical, chart, labs and discussed the procedure including the risks, benefits and alternatives for the proposed anesthesia with the patient or authorized representative who has indicated his/her understanding and acceptance.     Dental Advisory Given  Plan Discussed with:  CRNA  Anesthesia Plan Comments:         Anesthesia Quick Evaluation

## 2024-06-21 NOTE — Transfer of Care (Signed)
 Immediate Anesthesia Transfer of Care Note  Patient: Shari Parks  Procedure(s) Performed: EGD (ESOPHAGOGASTRODUODENOSCOPY)  Patient Location: PACU  Anesthesia Type:General  Level of Consciousness: awake, alert , and oriented  Airway & Oxygen Therapy: Patient Spontanous Breathing  Post-op Assessment: Report given to RN and Post -op Vital signs reviewed and stable  Post vital signs: Reviewed and stable  Last Vitals:  Vitals Value Taken Time  BP 121/67 06/21/24 09:53  Temp 36.1 C 06/21/24 09:53  Pulse 51 06/21/24 09:59  Resp 12 06/21/24 09:59  SpO2 97 % 06/21/24 09:59  Vitals shown include unfiled device data.  Last Pain:  Vitals:   06/21/24 0953  TempSrc: Temporal  PainSc: 0-No pain         Complications: No notable events documented.

## 2024-06-21 NOTE — Anesthesia Postprocedure Evaluation (Signed)
 Anesthesia Post Note  Patient: Deidrea Gaetz  Procedure(s) Performed: EGD (ESOPHAGOGASTRODUODENOSCOPY)  Patient location during evaluation: PACU Anesthesia Type: General Level of consciousness: awake and alert Pain management: pain level controlled Vital Signs Assessment: post-procedure vital signs reviewed and stable Respiratory status: spontaneous breathing, nonlabored ventilation, respiratory function stable and patient connected to nasal cannula oxygen Cardiovascular status: blood pressure returned to baseline and stable Postop Assessment: no apparent nausea or vomiting Anesthetic complications: no   No notable events documented.   Last Vitals:  Vitals:   06/21/24 0925 06/21/24 0953  BP: (!) 147/76 121/67  Pulse: (!) 59 (!) 55  Resp: 16 16  Temp: (!) 35.7 C (!) 36.1 C  SpO2: 100% 98%    Last Pain:  Vitals:   06/21/24 0953  TempSrc: Temporal  PainSc: 0-No pain                 Lynwood KANDICE Clause

## 2024-06-22 ENCOUNTER — Ambulatory Visit
Admission: RE | Admit: 2024-06-22 | Discharge: 2024-06-22 | Disposition: A | Discharge status: Home / Self Care | Source: Ambulatory Visit | Attending: Orthopedic Surgery | Admitting: Orthopedic Surgery

## 2024-06-22 DIAGNOSIS — M1711 Unilateral primary osteoarthritis, right knee: Secondary | ICD-10-CM

## 2024-06-22 DIAGNOSIS — M2391 Unspecified internal derangement of right knee: Secondary | ICD-10-CM

## 2024-06-22 LAB — SURGICAL PATHOLOGY

## 2024-06-30 ENCOUNTER — Other Ambulatory Visit: Payer: Self-pay

## 2024-06-30 ENCOUNTER — Encounter
Admission: RE | Admit: 2024-06-30 | Discharge: 2024-06-30 | Disposition: A | Discharge status: Home / Self Care | Source: Ambulatory Visit | Attending: Podiatry | Admitting: Podiatry

## 2024-06-30 DIAGNOSIS — Z01810 Encounter for preprocedural cardiovascular examination: Secondary | ICD-10-CM

## 2024-06-30 DIAGNOSIS — Q2112 Patent foramen ovale: Secondary | ICD-10-CM

## 2024-06-30 DIAGNOSIS — Z01812 Encounter for preprocedural laboratory examination: Secondary | ICD-10-CM

## 2024-06-30 DIAGNOSIS — M19072 Primary osteoarthritis, left ankle and foot: Secondary | ICD-10-CM

## 2024-06-30 DIAGNOSIS — I639 Cerebral infarction, unspecified: Secondary | ICD-10-CM

## 2024-06-30 HISTORY — DX: Other intervertebral disc displacement, lumbar region: M51.26

## 2024-06-30 HISTORY — DX: Unspecified osteoarthritis, unspecified site: M19.90

## 2024-06-30 HISTORY — DX: Benign neoplasm of left ovary: D27.1

## 2024-06-30 HISTORY — DX: Primary osteoarthritis, left ankle and foot: M19.072

## 2024-06-30 HISTORY — DX: Sleep apnea, unspecified: G47.30

## 2024-06-30 HISTORY — DX: Vitamin D deficiency, unspecified: E55.9

## 2024-06-30 HISTORY — DX: Patent foramen ovale: Q21.12

## 2024-06-30 HISTORY — DX: Gastro-esophageal reflux disease without esophagitis: K21.9

## 2024-06-30 HISTORY — DX: Morbid (severe) obesity due to excess calories: E66.01

## 2024-06-30 HISTORY — DX: Venous insufficiency (chronic) (peripheral): I87.2

## 2024-06-30 NOTE — Patient Instructions (Signed)
 Your procedure is scheduled on:07-07-24 Friday Report to the Registration Desk on the 1st floor of the Medical Mall.Then proceed to the 2nd floor Surgery Desk To find out your arrival time, please call [phone] between 1PM - 3PM on:07-06-24 Thursday If your arrival time is 6:00 am, do not arrive before that time as the Medical Mall entrance doors do not open until 6:00 am.  REMEMBER: Instructions that are not followed completely may result in serious medical risk, up to and including death; or upon the discretion of your surgeon and anesthesiologist your surgery may need to be rescheduled.  Do not eat food after midnight the night before surgery.  No gum chewing or hard candies.  You may however, drink CLEAR liquids up to 2 hours before you are scheduled to arrive for your surgery. Do not drink anything within 2 hours of your scheduled arrival time.  Clear liquids include: - water  - apple juice without pulp - gatorade (not RED colors) - black coffee or tea (Do NOT add milk or creamers to the coffee or tea) Do NOT drink anything that is not on this list.  In addition, your doctor has ordered for you to drink the provided:  Ensure Pre-Surgery Clear Carbohydrate Drink  Drinking this carbohydrate drink up to two hours before surgery helps to reduce insulin resistance and improve patient outcomes. Please complete drinking 2 hours before scheduled arrival time.  One week prior to surgery:Stop NOW (06-30-24) Stop Anti-inflammatories (NSAIDS) such as Advil, Aleve, Ibuprofen, Motrin, Naproxen, Naprosyn and Aspirin based products such as Excedrin, Goody's Powder, BC Powder. Stop ANY OVER THE COUNTER supplements until after surgery (Melatonin, Vitamin D3, Multivitamin, Vitamin B12)  You may however, continue to take Tylenol if needed for pain up until the day of surgery.  Last dose of Aspirin was on 06-29-24 (Was instructed to stop by surgeon 7 days prior to surgery)  Continue taking all of  your other prescription medications up until the day of surgery.  ON THE DAY OF SURGERY ONLY TAKE THESE MEDICATIONS WITH SIPS OF WATER: -pantoprazole (PROTONIX)   No Alcohol for 24 hours before or after surgery.  No Smoking including e-cigarettes for 24 hours before surgery.  No chewable tobacco products for at least 6 hours before surgery.  No nicotine patches on the day of surgery.  Do not use any recreational drugs for at least a week (preferably 2 weeks) before your surgery.  Please be advised that the combination of cocaine and anesthesia may have negative outcomes, up to and including death. If you test positive for cocaine, your surgery will be cancelled.  On the morning of surgery brush your teeth with toothpaste and water, you may rinse your mouth with mouthwash if you wish. Do not swallow any toothpaste or mouthwash.  Use CHG Soap as directed on instruction sheet.  Do not wear jewelry, make-up, hairpins, clips or nail polish.  For welded (permanent) jewelry: bracelets, anklets, waist bands, etc.  Please have this removed prior to surgery.  If it is not removed, there is a chance that hospital personnel will need to cut it off on the day of surgery.  Do not wear lotions, powders, or perfumes.   Do not shave body hair from the neck down 48 hours before surgery.  Contact lenses, hearing aids and dentures may not be worn into surgery.  Do not bring valuables to the hospital. [facility] is not responsible for any missing/lost belongings or valuables.   Notify your doctor  if there is any change in your medical condition (cold, fever, infection).  Wear comfortable clothing (specific to your surgery type) to the hospital.  After surgery, you can help prevent lung complications by doing breathing exercises.  Take deep breaths and cough every 1-2 hours. Your doctor may order a device called an Incentive Spirometer to help you take deep breaths. When coughing or sneezing, hold  a pillow firmly against your incision with both hands. This is called "splinting." Doing this helps protect your incision. It also decreases belly discomfort.  If you are being admitted to the hospital overnight, leave your suitcase in the car. After surgery it may be brought to your room.  In case of increased patient census, it may be necessary for you, the patient, to continue your postoperative care in the Same Day Surgery department.  If you are being discharged the day of surgery, you will not be allowed to drive home. You will need a responsible individual to drive you home and stay with you for 24 hours after surgery.   If you are taking public transportation, you will need to have a responsible individual with you.  Please call the Pre-admissions Testing Dept. at [phone] if you have any questions about these instructions.  Surgery Visitation Policy:  Patients having surgery or a procedure may have two visitors.  Children under the age of 27 must have an adult with them who is not the patient.                                                                                                             Preparing for Surgery with CHLORHEXIDINE GLUCONATE (CHG) Soap  Chlorhexidine Gluconate (CHG) Soap  o An antiseptic cleaner that kills germs and bonds with the skin to continue killing germs even after washing  o Used for showering the night before surgery and morning of surgery  Before surgery, you can play an important role by reducing the number of germs on your skin.  CHG (Chlorhexidine gluconate) soap is an antiseptic cleanser which kills germs and bonds with the skin to continue killing germs even after washing.  Please do not use if you have an allergy to CHG or antibacterial soaps. If your skin becomes reddened/irritated stop using the CHG.  1. Shower the NIGHT BEFORE SURGERY with CHG soap.  2. If you choose to wash your hair, wash your hair first as usual with  your normal shampoo.  3. After shampooing, rinse your hair and body thoroughly to remove the shampoo.  4. Use CHG as you would any other liquid soap. You can apply CHG directly to the skin and wash gently with a clean washcloth.  5. Apply the CHG soap to your body only from the neck down. Do not use on open wounds or open sores. Avoid contact with your eyes, ears, mouth, and genitals (private parts). Wash face and genitals (private parts) with your normal soap.  6. Wash thoroughly, paying special attention to the area where your surgery will be performed.  7. Thoroughly rinse your body with warm water.  8. Do not shower/wash with your normal soap after using and rinsing off the CHG soap.  9. Do not use lotions, oils, etc., after showering with CHG.  10. Pat yourself dry with a clean towel.  11. Wear clean pajamas to bed the night before surgery.  12. Place clean sheets on your bed the night of your shower and do not sleep with pets.  13. Do not apply any deodorants/lotions/powders.  14. Please wear clean clothes to the hospital.  15. Remember to brush your teeth with your regular toothpaste.   Merchandiser, retail to address health-related social needs:  [email or web address]

## 2024-07-03 ENCOUNTER — Encounter
Admission: RE | Admit: 2024-07-03 | Discharge: 2024-07-03 | Disposition: A | Discharge status: Home / Self Care | Source: Ambulatory Visit | Attending: Podiatry | Admitting: Podiatry

## 2024-07-03 DIAGNOSIS — I639 Cerebral infarction, unspecified: Secondary | ICD-10-CM | POA: Insufficient documentation

## 2024-07-03 DIAGNOSIS — Z01818 Encounter for other preprocedural examination: Secondary | ICD-10-CM | POA: Insufficient documentation

## 2024-07-03 DIAGNOSIS — Z01810 Encounter for preprocedural cardiovascular examination: Secondary | ICD-10-CM

## 2024-07-03 DIAGNOSIS — Q2112 Patent foramen ovale: Secondary | ICD-10-CM | POA: Diagnosis not present

## 2024-07-03 DIAGNOSIS — Z01812 Encounter for preprocedural laboratory examination: Secondary | ICD-10-CM

## 2024-07-03 LAB — CBC
HCT: 42.5 % (ref 36.0–46.0)
Hemoglobin: 14.1 g/dL (ref 12.0–15.0)
MCH: 29.6 pg (ref 26.0–34.0)
MCHC: 33.2 g/dL (ref 30.0–36.0)
MCV: 89.3 fL (ref 80.0–100.0)
Platelets: 257 K/uL (ref 150–400)
RBC: 4.76 MIL/uL (ref 3.87–5.11)
RDW: 13.7 % (ref 11.5–15.5)
WBC: 6.6 K/uL (ref 4.0–10.5)
nRBC: 0 % (ref 0.0–0.2)

## 2024-07-03 LAB — BASIC METABOLIC PANEL WITH GFR
Anion gap: 13 (ref 5–15)
BUN: 18 mg/dL (ref 6–20)
CO2: 23 mmol/L (ref 22–32)
Calcium: 9.3 mg/dL (ref 8.9–10.3)
Chloride: 106 mmol/L (ref 98–111)
Creatinine, Ser: 0.73 mg/dL (ref 0.44–1.00)
GFR, Estimated: >60 mL/min (ref >60)
Glucose, Bld: 98 mg/dL (ref 70–99)
Potassium: 4.2 mmol/L (ref 3.5–5.1)
Sodium: 142 mmol/L (ref 135–145)

## 2024-07-07 ENCOUNTER — Other Ambulatory Visit: Payer: Self-pay

## 2024-07-07 ENCOUNTER — Ambulatory Visit: Admitting: Anesthesiology

## 2024-07-07 ENCOUNTER — Encounter
Admission: RE | Disposition: A | Discharge status: Home / Self Care | Payer: Self-pay | Source: Home / Self Care | Attending: Podiatry

## 2024-07-07 ENCOUNTER — Encounter: Payer: Self-pay | Admitting: Podiatry

## 2024-07-07 ENCOUNTER — Ambulatory Visit

## 2024-07-07 ENCOUNTER — Ambulatory Visit
Admission: RE | Admit: 2024-07-07 | Discharge: 2024-07-07 | Disposition: A | Discharge status: Home / Self Care | Attending: Podiatry | Admitting: Podiatry

## 2024-07-07 DIAGNOSIS — M19072 Primary osteoarthritis, left ankle and foot: Secondary | ICD-10-CM | POA: Diagnosis present

## 2024-07-07 DIAGNOSIS — I69398 Other sequelae of cerebral infarction: Secondary | ICD-10-CM | POA: Insufficient documentation

## 2024-07-07 DIAGNOSIS — H547 Unspecified visual loss: Secondary | ICD-10-CM | POA: Insufficient documentation

## 2024-07-07 DIAGNOSIS — K219 Gastro-esophageal reflux disease without esophagitis: Secondary | ICD-10-CM | POA: Diagnosis not present

## 2024-07-07 DIAGNOSIS — G4730 Sleep apnea, unspecified: Secondary | ICD-10-CM | POA: Insufficient documentation

## 2024-07-07 DIAGNOSIS — K449 Diaphragmatic hernia without obstruction or gangrene: Secondary | ICD-10-CM | POA: Diagnosis not present

## 2024-07-07 DIAGNOSIS — Z6841 Body Mass Index (BMI) 40.0 and over, adult: Secondary | ICD-10-CM | POA: Insufficient documentation

## 2024-07-07 DIAGNOSIS — E66813 Obesity, class 3: Secondary | ICD-10-CM | POA: Insufficient documentation

## 2024-07-07 SURGERY — FUSION, JOINT, FOOT
Anesthesia: General | Site: Foot | Laterality: Left

## 2024-07-07 MED ORDER — ENOXAPARIN SODIUM 40 MG/0.4ML IJ SOSY
PREFILLED_SYRINGE | INTRAMUSCULAR | Status: AC
  Filled 2024-07-07: qty 0.4

## 2024-07-07 MED ORDER — CEFAZOLIN SODIUM-DEXTROSE 2-4 GM/100ML-% IV SOLN
INTRAVENOUS | Status: AC
  Filled 2024-07-07: qty 100

## 2024-07-07 MED ORDER — PROPOFOL 1000 MG/100ML IV EMUL
INTRAVENOUS | Status: AC
  Filled 2024-07-07: qty 100

## 2024-07-07 MED ORDER — ONDANSETRON HCL 4 MG/2ML IJ SOLN
INTRAMUSCULAR | Status: DC | PRN
  Administered 2024-07-07 (×2): 4 mg via INTRAVENOUS

## 2024-07-07 MED ORDER — ONDANSETRON HCL 4 MG/2ML IJ SOLN: 4.0000 mg | Freq: Once | INTRAMUSCULAR | Status: DC | PRN

## 2024-07-07 MED ORDER — BUPIVACAINE HCL (PF) 0.5 % IJ SOLN
INTRAMUSCULAR | Status: AC
  Filled 2024-07-07: qty 30

## 2024-07-07 MED ORDER — SUCCINYLCHOLINE CHLORIDE 200 MG/10ML IV SOSY
PREFILLED_SYRINGE | INTRAVENOUS | Status: DC | PRN
  Administered 2024-07-07: 100 mg via INTRAVENOUS

## 2024-07-07 MED ORDER — FENTANYL CITRATE (PF) 100 MCG/2ML IJ SOLN: 25.0000 ug | INTRAMUSCULAR | Status: DC | PRN

## 2024-07-07 MED ORDER — HEPARIN SODIUM (PORCINE) 5000 UNIT/ML IJ SOLN
INTRAMUSCULAR | Status: AC
  Filled 2024-07-07: qty 1

## 2024-07-07 MED ORDER — DEXMEDETOMIDINE HCL IN NACL 200 MCG/50ML IV SOLN
INTRAVENOUS | Status: DC | PRN
  Administered 2024-07-07 (×2): 8 ug via INTRAVENOUS
  Administered 2024-07-07: 4 ug via INTRAVENOUS

## 2024-07-07 MED ORDER — ACETAMINOPHEN 10 MG/ML IV SOLN
INTRAVENOUS | Status: DC | PRN
  Administered 2024-07-07: 1000 mg via INTRAVENOUS

## 2024-07-07 MED ORDER — LIDOCAINE HCL (CARDIAC) PF 100 MG/5ML IV SOSY
PREFILLED_SYRINGE | INTRAVENOUS | Status: DC | PRN
  Administered 2024-07-07: 100 mg via INTRAVENOUS

## 2024-07-07 MED ORDER — DEXAMETHASONE SOD PHOSPHATE PF 10 MG/ML IJ SOLN
INTRAMUSCULAR | Status: DC | PRN
  Administered 2024-07-07: 10 mg via INTRAVENOUS

## 2024-07-07 MED ORDER — ORAL CARE MOUTH RINSE: 15.0000 mL | Freq: Once | OROMUCOSAL | Status: AC

## 2024-07-07 MED ORDER — 0.9 % SODIUM CHLORIDE (POUR BTL) OPTIME
TOPICAL | Status: DC | PRN
  Administered 2024-07-07: 1000 mL

## 2024-07-07 MED ORDER — OXYCODONE-ACETAMINOPHEN 5-325 MG PO TABS: 1.0000 | ORAL_TABLET | Freq: Four times a day (QID) | ORAL | 0 refills | Status: DC | PRN

## 2024-07-07 MED ORDER — PROPOFOL 10 MG/ML IV BOLUS
INTRAVENOUS | Status: DC | PRN
  Administered 2024-07-07: 200 mg via INTRAVENOUS

## 2024-07-07 MED ORDER — ACETAMINOPHEN 10 MG/ML IV SOLN
INTRAVENOUS | Status: AC
  Filled 2024-07-07: qty 100

## 2024-07-07 MED ORDER — FENTANYL CITRATE (PF) 100 MCG/2ML IJ SOLN
INTRAMUSCULAR | Status: AC
  Filled 2024-07-07: qty 2

## 2024-07-07 MED ORDER — CHLORHEXIDINE GLUCONATE 0.12 % MT SOLN
15.0000 mL | Freq: Once | OROMUCOSAL | Status: AC
  Administered 2024-07-07: 15 mL via OROMUCOSAL

## 2024-07-07 MED ORDER — ACETAMINOPHEN 10 MG/ML IV SOLN: 1000.0000 mg | Freq: Once | INTRAVENOUS | Status: DC | PRN

## 2024-07-07 MED ORDER — HEPARIN SODIUM (PORCINE) 5000 UNIT/ML IJ SOLN
INTRAMUSCULAR | Status: DC | PRN
Start: 2024-07-07 — End: 2024-07-07
  Administered 2024-07-07: 5000 [IU] via SUBCUTANEOUS

## 2024-07-07 MED ORDER — BUPIVACAINE LIPOSOME 1.3 % IJ SUSP
INTRAMUSCULAR | Status: DC | PRN
  Administered 2024-07-07: 20 mL via INTRAMUSCULAR

## 2024-07-07 MED ORDER — ENOXAPARIN SODIUM 40 MG/0.4ML IJ SOSY
40.0000 mg | PREFILLED_SYRINGE | Freq: Once | INTRAMUSCULAR | Status: AC
  Administered 2024-07-07: 40 mg via SUBCUTANEOUS

## 2024-07-07 MED ORDER — CEFAZOLIN SODIUM-DEXTROSE 2-4 GM/100ML-% IV SOLN
2.0000 g | INTRAVENOUS | Status: AC
  Administered 2024-07-07: 2 g via INTRAVENOUS
  Administered 2024-07-07: 1 g via INTRAVENOUS

## 2024-07-07 MED ORDER — MIDAZOLAM HCL 2 MG/2ML IJ SOLN
INTRAMUSCULAR | Status: AC
  Filled 2024-07-07: qty 2

## 2024-07-07 MED ORDER — BUPIVACAINE HCL (PF) 0.5 % IJ SOLN
INTRAMUSCULAR | Status: DC | PRN
  Administered 2024-07-07: 5 mL

## 2024-07-07 MED ORDER — GLYCOPYRROLATE 0.2 MG/ML IJ SOLN
INTRAMUSCULAR | Status: DC | PRN
  Administered 2024-07-07: .2 mg via INTRAVENOUS

## 2024-07-07 MED ORDER — FENTANYL CITRATE (PF) 100 MCG/2ML IJ SOLN
INTRAMUSCULAR | Status: DC | PRN
  Administered 2024-07-07 (×2): 50 ug via INTRAVENOUS

## 2024-07-07 MED ORDER — SUGAMMADEX SODIUM 200 MG/2ML IV SOLN
INTRAVENOUS | Status: DC | PRN
  Administered 2024-07-07: 100 mg via INTRAVENOUS
  Administered 2024-07-07: 200 mg via INTRAVENOUS

## 2024-07-07 MED ORDER — OXYCODONE HCL 5 MG/5ML PO SOLN: 5.0000 mg | Freq: Once | ORAL | Status: DC | PRN

## 2024-07-07 MED ORDER — LACTATED RINGERS IV SOLN
INTRAVENOUS | Status: DC
Start: 2024-07-07 — End: 2024-07-07

## 2024-07-07 MED ORDER — ROCURONIUM BROMIDE 100 MG/10ML IV SOLN
INTRAVENOUS | Status: DC | PRN
  Administered 2024-07-07: 40 mg via INTRAVENOUS
  Administered 2024-07-07 (×2): 20 mg via INTRAVENOUS
  Administered 2024-07-07: 10 mg via INTRAVENOUS
  Administered 2024-07-07: 20 mg via INTRAVENOUS

## 2024-07-07 MED ORDER — LACTATED RINGERS IV SOLN: INTRAVENOUS | Status: DC

## 2024-07-07 MED ORDER — HEPARIN SOD (PORK) LOCK FLUSH 100 UNIT/ML IV SOLN
INTRAVENOUS | Status: AC
  Filled 2024-07-07: qty 5

## 2024-07-07 MED ORDER — MIDAZOLAM HCL 2 MG/2ML IJ SOLN
INTRAMUSCULAR | Status: DC | PRN
Start: 2024-07-07 — End: 2024-07-07
  Administered 2024-07-07: 2 mg via INTRAVENOUS

## 2024-07-07 MED ORDER — BUPIVACAINE LIPOSOME 1.3 % IJ SUSP
INTRAMUSCULAR | Status: AC
  Filled 2024-07-07: qty 20

## 2024-07-07 MED ORDER — OXYCODONE HCL 5 MG PO TABS: 5.0000 mg | ORAL_TABLET | Freq: Once | ORAL | Status: DC | PRN

## 2024-07-07 MED ORDER — CHLORHEXIDINE GLUCONATE 0.12 % MT SOLN
OROMUCOSAL | Status: AC
  Filled 2024-07-07: qty 15

## 2024-07-07 SURGICAL SUPPLY — 75 items
ALLOGRAFT BONE FIBER KORE 5 (Bone Implant) IMPLANT
BIT DRILL 2 FENESTRATED (MISCELLANEOUS) IMPLANT
BIT DRILL 2.4X140 LONG SOLID (BIT) IMPLANT
BIT DRILL CANNULTD 2.6 X 130MM (DRILL) IMPLANT
BIT DRILL SOLID 2.0 X 110MM (DRILL) IMPLANT
BLADE SURG 15 STRL LF DISP TIS (BLADE) ×2 IMPLANT
BNDG COHESIVE 4X5 TAN STRL LF (GAUZE/BANDAGES/DRESSINGS) ×1 IMPLANT
BNDG ELASTIC 4X5.8 VLCR NS LF (GAUZE/BANDAGES/DRESSINGS) ×2 IMPLANT
BNDG ESMARCH 4X12 STRL LF (GAUZE/BANDAGES/DRESSINGS) ×1 IMPLANT
BNDG GAUZE DERMACEA FLUFF 4 (GAUZE/BANDAGES/DRESSINGS) ×1 IMPLANT
BNDG STRETCH GAUZE 3IN X12FT (GAUZE/BANDAGES/DRESSINGS) ×1 IMPLANT
BOOT WALKER MEDIUM (SOFTGOODS) IMPLANT
BUR 4X45 EGG (BURR) ×1 IMPLANT
COVER PIN YLW 0.028-062 (MISCELLANEOUS) ×2 IMPLANT
CUFF TOURN SGL QUICK 18X4 (TOURNIQUET CUFF) IMPLANT
CUFF TRNQT CYL 24X4X16.5-23 (TOURNIQUET CUFF) IMPLANT
DRAPE C-ARM XRAY 36X54 (DRAPES) ×1 IMPLANT
DRAPE C-ARMOR (DRAPES) ×1 IMPLANT
DRAPE FLUOR MINI C-ARM 54X84 (DRAPES) IMPLANT
DURAPREP 26ML APPLICATOR (WOUND CARE) ×1 IMPLANT
ELECTRODE REM PT RTRN 9FT ADLT (ELECTROSURGICAL) ×1 IMPLANT
GAUZE 4X4 16PLY ~~LOC~~+RFID DBL (SPONGE) IMPLANT
GAUZE SPONGE 4X4 12PLY STRL (GAUZE/BANDAGES/DRESSINGS) ×1 IMPLANT
GAUZE STRETCH 2X75IN STRL (MISCELLANEOUS) ×1 IMPLANT
GAUZE XEROFORM 1X8 LF (GAUZE/BANDAGES/DRESSINGS) ×1 IMPLANT
GLOVE BIO SURGEON STRL SZ7.5 (GLOVE) ×1 IMPLANT
GLOVE INDICATOR 8.0 STRL GRN (GLOVE) ×1 IMPLANT
GOWN STRL REUS W/ TWL XL LVL3 (GOWN DISPOSABLE) ×2 IMPLANT
KIT ASP BONE MRW 11G (KITS) IMPLANT
KIT TURNOVER KIT A (KITS) ×1 IMPLANT
KWIRE SNGL END 1.2X150 (MISCELLANEOUS) IMPLANT
MANIFOLD NEPTUNE II (INSTRUMENTS) ×1 IMPLANT
NDL FILTER BLUNT 18X1 1/2 (NEEDLE) ×1 IMPLANT
NDL HYPO 25X1 1.5 SAFETY (NEEDLE) ×2 IMPLANT
NEEDLE FILTER BLUNT 18X1 1/2 (NEEDLE) ×1 IMPLANT
NEEDLE HYPO 25X1 1.5 SAFETY (NEEDLE) ×2 IMPLANT
NS IRRIG 1000ML POUR BTL (IV SOLUTION) IMPLANT
PACK EXTREMITY ARMC (MISCELLANEOUS) ×1 IMPLANT
PADDING CAST BLEND 4X4 NS (MISCELLANEOUS) ×1 IMPLANT
PENCIL SMOKE EVACUATOR (MISCELLANEOUS) ×1 IMPLANT
PLATE 4HOLE IMPLANT
PLATE TMT SLANTED-T 3H LEFT (Plate) IMPLANT
RASP SM TEAR CROSS CUT (RASP) IMPLANT
SCREW 2.7 X 15 NON LOCK (Screw) IMPLANT
SCREW 4.0X24 ST (Screw) IMPLANT
SCREW CANN 4.0X38 SHORT THREAD (Screw) IMPLANT
SCREW COUNTERSINK 4.0 HEADED (MISCELLANEOUS) IMPLANT
SCREW LOCK PLATE R3 2.7X13 (Screw) IMPLANT
SCREW LOCK PLATE R3 2.7X14 (Screw) IMPLANT
SCREW LOCK PLATE R3 2.7X16 (Screw) IMPLANT
SCREW LOCK PLATE R3 2.7X18 (Screw) IMPLANT
SCREW LOCK PLATE R3 2.7X20 (Screw) IMPLANT
SCREW LOCK PLATE R3 2.7X22 (Screw) IMPLANT
SCREW NON LOCK PLATE 2.7X16M (Screw) IMPLANT
SOLN 0.9% NACL 1000 ML (IV SOLUTION) IMPLANT
SOLN 0.9% NACL POUR BTL 1000ML (IV SOLUTION) ×1 IMPLANT
SPLINT CAST 1 STEP 4X30 (MISCELLANEOUS) ×1 IMPLANT
SPLINT PLASTER CAST FAST 5X30 (CAST SUPPLIES) ×1 IMPLANT
SPONGE T-LAP 18X18 ~~LOC~~+RFID (SPONGE) IMPLANT
STOCKINETTE M/LG 89821 (MISCELLANEOUS) ×1 IMPLANT
STRIP CLOSURE SKIN 1/4X4 (GAUZE/BANDAGES/DRESSINGS) ×1 IMPLANT
SUT VIC AB 3-0 SH 27X BRD (SUTURE) ×1 IMPLANT
SUT VIC AB 4-0 SH 27XANBCTRL (SUTURE) IMPLANT
SUTURE EHLN 3-0 FS-10 30 BLK (SUTURE) ×1 IMPLANT
SUTURE ETHLN 4-0 FS2 18XMF BLK (SUTURE) ×1 IMPLANT
SUTURE MNCRL 4-0 27XMF (SUTURE) IMPLANT
SYR 10CC LOCKING BMA NDL (SYRINGE) IMPLANT
SYR 10CC LOCKING BMA NEEDLE (SYRINGE) ×1 IMPLANT
SYR 10ML LL (SYRINGE) ×1 IMPLANT
TOWEL OR 17X26 4PK STRL BLUE (TOWEL DISPOSABLE) ×1 IMPLANT
TRAP FLUID SMOKE EVACUATOR (MISCELLANEOUS) ×1 IMPLANT
WASHER ORTH 4 SCR (Miscellaneous) IMPLANT
WATER STERILE IRR 500ML POUR (IV SOLUTION) ×1 IMPLANT
WIRE OLIVE SMOOTH 1.4MMX60MM (WIRE) IMPLANT
WIRE Z .062 C-WIRE SPADE TIP (WIRE) ×3 IMPLANT

## 2024-07-07 NOTE — Anesthesia Postprocedure Evaluation (Signed)
 Anesthesia Post Note  Patient: Shari Parks  Procedure(s) Performed: ARTHRODESIS SECOND AND THIRD TARSOMETATARSAL JOINT AND MEDIAL NAVICULAR CUNEIFORM JOINT LEFT FOOT (Left: Foot)  Patient location during evaluation: PACU Anesthesia Type: General Level of consciousness: awake and alert, oriented and patient cooperative Pain management: pain level controlled Vital Signs Assessment: post-procedure vital signs reviewed and stable Respiratory status: spontaneous breathing, nonlabored ventilation and respiratory function stable Cardiovascular status: blood pressure returned to baseline and stable Postop Assessment: adequate PO intake Anesthetic complications: no   No notable events documented.   Last Vitals:  Vitals:   07/07/24 1135 07/07/24 1140  BP:    Pulse: 67 71  Resp: 13 12  Temp:  36.7 C  SpO2: 93% 90%    Last Pain:  Vitals:   07/07/24 1110  TempSrc:   PainSc: 0-No pain                 Alfonso Ruths

## 2024-07-07 NOTE — Anesthesia Procedure Notes (Signed)
 Procedure Name: Intubation Date/Time: 07/07/2024 7:32 AM  Performed by: Ledora Duncan, CRNAPre-anesthesia Checklist: Patient identified, Emergency Drugs available, Suction available and Patient being monitored Patient Re-evaluated:Patient Re-evaluated prior to induction Oxygen Delivery Method: Circle system utilized Preoxygenation: Pre-oxygenation with 100% oxygen Induction Type: IV induction Ventilation: Mask ventilation without difficulty Laryngoscope Size: McGrath and 3 Grade View: Grade I Tube type: Oral Number of attempts: 1 Airway Equipment and Method: Stylet Placement Confirmation: ETT inserted through vocal cords under direct vision, positive ETCO2 and breath sounds checked- equal and bilateral Secured at: 21 cm Tube secured with: Tape Dental Injury: Teeth and Oropharynx as per pre-operative assessment

## 2024-07-07 NOTE — H&P (Signed)
 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  The various methods of treatment have been discussed with the patient.  No guarantees were given.  After consideration of risks, benefits and other options for treatment, the patient has consented to surgery.  I have reviewed the patients' chart and labs.     A history and physical examination was performed in my office.  The patient was reexamined.  There have been no changes to this history and physical examination.  Ashley Soulier A

## 2024-07-07 NOTE — Op Note (Signed)
 Operative note   Surgeon:Jahniya Duzan [employer] logistics/support/administrative officer: None    Preop diagnosis: Osteoarthritis left midfoot    Postop diagnosis: Same    Procedure: 1.  Arthrodesis left second metatarsal cuneiform joint 2.  Arthrodesis left third metatarsal cuneiform joint 3.  Left medial navicular cuneiform joint 4.  Bone marrow aspirate Harvest of graft for arthrodesis 5.  Intraoperative fluoroscopy without assistance of radiologist    EBL: 30 mL    Anesthesia:local and general.  Local consist of a total of 30 cc of 0.5% bupivacaine  and 20 cc of Exparel  long-acting anesthetic and a local block fashion    Hemostasis: Mid calf tourniquet inflated to 200 mmHg for 60 minutes.  Deflated for 10 minutes.  Reinflated for an additional 90 minutes.    Specimen: None    Complications: None    Operative indications:Shari Parks is an 58 y.o. that presents today for surgical intervention.  The risks/benefits/alternatives/complications have been discussed and consent has been given.    Procedure:  Patient was brought into the OR and placed on the operating table in thesupine position. After anesthesia was obtained theleft lower extremity was prepped and draped in usual sterile fashion.  Attention was directed to the dorsal aspect of the left foot along the 2nd and 3rd metatarsal cuneiform joints where a dorsal incision was made.  Sharp and blunt dissection carried down to the deeper tissue.  Subperiosteal dissection was then performed.  Care was taken to retract all vital neurovascular structures and Bovie cauterize bleeders as needed.  At this time the 2nd and 3rd metatarsal cuneiform joints were noted with a large amount of para-articular spurring.  This was excised with a rongeur and smoothed with a power rasp.  Next the 2nd and 3rd met cuneiform joints were noted with minimal articular cartilage.  What little bit of remaining articular cartilage was removed down to the subchondral bone plate.  This was flushed.   Next the joint was prepared with a 2.0 mm drill bit.  Attention was then directed to the medial aspect along the navicular cuneiform joint where an incision was performed.  Sharp and blunt dissection carried down to the anterior tibial tendon.  This was retracted throughout the entire procedure.  Subperiosteal dissection was then performed exposing the entire navicular cuneiform joint medially.  The medial navicular cuneiform joint was then prepared and all articular cartilage was removed.  The joint was prepped with a 2.0 mm drill bit.  At this time the harvest of the bone marrow aspirate was performed with a small incision laterally along the calcaneus.  The [employer] bone marrow aspirate harvester was used and 10 cc of bone marrow aspirate were removed.  This was then mixed with 5 cc of cortical fiber.  This bone graft was then placed into all joint sites after preparation.  The navicular cuneiform joint was fused first with 2 crossing 4.0 mm cannulated screws.  Good stability was noted.  Good compression was noted.  Attention was then directed to the dorsal aspect of the foot where dorsal locking plates were placed from the [facility] set.  A 5 hole plate was placed over the second met cuneiform joint and a 4-hole plate over the cuneiform joint with 2.7 millimeter screws with compression screws used within the plate itself.  Excellent stability was noted with multiple use of fluoroscopy opposite to visualize lateral fusion sites with good alignment and coverage noted.  All wounds were flushed with copious amounts of irrigation.  Closure  was then performed with a 3-0 Vicryl for the deeper tissue, 4-0 Vicryl for the subcutaneous tissue and a 4-0 Monocryl for the skin.  The percutaneous sites were closed with a 3-0 nylon.  A bulky sterile dressing was applied to all areas.  Patient was placed in an equalizer walker boot with the foot at 90 degree neutral position.    Patient tolerated the procedure and  anesthesia well.  Was transported from the OR to the PACU with all vital signs stable and vascular status intact. To be discharged per routine protocol.  Will follow up in approximately 1 week in the outpatient clinic.

## 2024-07-07 NOTE — Discharge Instructions (Signed)
[facility]  POST OPERATIVE INSTRUCTIONS FOR DR. Ether Griffins AND DR. BAKER [facility] P[facility]PART[facility]   Take your medication as prescribed.  Pain medication should be taken only as needed.  Keep the dressing clean, dry and intact.  Keep your foot elevated above the heart level for the first 48 hours.  We have instructed you to be non-weight bearing.  Always wear your post-op shoe when walking.  Always use your crutches if you are to be non-weight bearing.  Do not take a shower. Baths are permissible as long as the foot is kept out of the water.   Every hour you are awake:  Bend your knee 15 times.   Call [facility] [phone]) if any of the following problems occur: You develop a temperature or fever. The bandage becomes saturated with blood. Medication does not stop your pain. Injury of the foot occurs. Any symptoms of infection including redness, odor, or red streaks running from wound.

## 2024-07-07 NOTE — Anesthesia Preprocedure Evaluation (Addendum)
 Anesthesia Evaluation  Patient identified by MRN, date of birth, ID band Patient awake    Reviewed: Allergy & Precautions, NPO status , Patient's Chart, lab work & pertinent test results  History of Anesthesia Complications Negative for: history of anesthetic complications  Airway Mallampati: II   Neck ROM: Full    Dental no notable dental hx.    Pulmonary sleep apnea    Pulmonary exam normal breath sounds clear to auscultation       Cardiovascular Normal cardiovascular exam+ Valvular Problems/Murmurs (PFO)  Rhythm:Regular Rate:Normal  ECG 07/03/24: Sinus bradycardia Otherwise normal ECG When compared with ECG of 21-May-2010 09:28, No significant change was found   Neuro/Psych CVA (2018; residual right upper quadrant vision loss)    GI/Hepatic hiatal hernia,GERD  ,,  Endo/Other    Class 3 obesity  Renal/GU negative Renal ROS     Musculoskeletal  (+) Arthritis ,    Abdominal   Peds  Hematology negative hematology ROS (+)   Anesthesia Other Findings   Reproductive/Obstetrics                              Anesthesia Physical Anesthesia Plan  ASA: 3  Anesthesia Plan: General   Post-op Pain Management:    Induction: Intravenous  PONV Risk Score and Plan: 3 and Ondansetron , Dexamethasone  and Treatment may vary due to age or medical condition  Airway Management Planned: Oral ETT  Additional Equipment:   Intra-op Plan:   Post-operative Plan: Extubation in OR  Informed Consent: I have reviewed the patients History and Physical, chart, labs and discussed the procedure including the risks, benefits and alternatives for the proposed anesthesia with the patient or authorized representative who has indicated his/her understanding and acceptance.     Dental advisory given  Plan Discussed with: CRNA  Anesthesia Plan Comments: (Patient consented for risks of anesthesia including but  not limited to:  - adverse reactions to medications - damage to eyes, teeth, lips or other oral mucosa - nerve damage due to positioning  - sore throat or hoarseness - damage to heart, brain, nerves, lungs, other parts of body or loss of life  Informed patient about role of CRNA in peri- and intra-operative care.  Patient voiced understanding.)         Anesthesia Quick Evaluation

## 2024-07-07 NOTE — Transfer of Care (Signed)
 Immediate Anesthesia Transfer of Care Note  Patient: Shari Parks  Procedure(s) Performed: ARTHRODESIS SECOND AND THIRD TARSOMETATARSAL JOINT AND MEDIAL NAVICULAR CUNEIFORM JOINT LEFT FOOT (Left: Foot)  Patient Location: PACU  Anesthesia Type:General  Level of Consciousness: awake, drowsy, and patient cooperative  Airway & Oxygen Therapy: Patient Spontanous Breathing and Patient connected to face mask oxygen  Post-op Assessment: Report given to RN and Post -op Vital signs reviewed and stable  Post vital signs: Reviewed and stable  Last Vitals:  Vitals Value Taken Time  BP 133/93 07/07/24 11:01  Temp    Pulse 69 07/07/24 11:04  Resp 18 07/07/24 11:04  SpO2 99 % 07/07/24 11:04  Vitals shown include unfiled device data.  Last Pain:  Vitals:   07/07/24 0615  TempSrc: Temporal  PainSc: 0-No pain         Complications: No notable events documented.

## 2024-07-10 ENCOUNTER — Encounter: Payer: Self-pay | Admitting: Podiatry

## 2024-07-28 ENCOUNTER — Other Ambulatory Visit: Payer: Self-pay | Admitting: Gerontology

## 2024-07-28 DIAGNOSIS — Z1231 Encounter for screening mammogram for malignant neoplasm of breast: Secondary | ICD-10-CM

## 2024-09-04 ENCOUNTER — Ambulatory Visit
Admission: RE | Admit: 2024-09-04 | Discharge: 2024-09-04 | Disposition: A | Source: Ambulatory Visit | Attending: Gerontology

## 2024-09-04 DIAGNOSIS — Z1231 Encounter for screening mammogram for malignant neoplasm of breast: Secondary | ICD-10-CM

## 2024-09-12 ENCOUNTER — Other Ambulatory Visit: Payer: Self-pay | Admitting: Gerontology

## 2024-09-12 DIAGNOSIS — F5101 Primary insomnia: Secondary | ICD-10-CM

## 2024-09-12 DIAGNOSIS — R918 Other nonspecific abnormal finding of lung field: Secondary | ICD-10-CM

## 2024-10-17 ENCOUNTER — Other Ambulatory Visit: Payer: Self-pay | Admitting: Surgery

## 2024-10-24 ENCOUNTER — Other Ambulatory Visit: Payer: Self-pay

## 2024-10-24 ENCOUNTER — Encounter
Admission: RE | Admit: 2024-10-24 | Discharge: 2024-10-24 | Disposition: A | Source: Ambulatory Visit | Attending: Surgery

## 2024-10-24 VITALS — BP 127/58 | HR 67 | Temp 98.2°F | Resp 16 | Ht 66.0 in | Wt 259.0 lb

## 2024-10-24 DIAGNOSIS — Z01812 Encounter for preprocedural laboratory examination: Secondary | ICD-10-CM | POA: Insufficient documentation

## 2024-10-24 DIAGNOSIS — Z01818 Encounter for other preprocedural examination: Secondary | ICD-10-CM | POA: Diagnosis present

## 2024-10-24 HISTORY — DX: Pain in left knee: M25.562

## 2024-10-24 LAB — URINALYSIS, ROUTINE W REFLEX MICROSCOPIC
Bilirubin Urine: NEGATIVE
Glucose, UA: NEGATIVE mg/dL
Hgb urine dipstick: NEGATIVE
Ketones, ur: 5 mg/dL — AB
Nitrite: NEGATIVE
Protein, ur: NEGATIVE mg/dL
Specific Gravity, Urine: 1.025 (ref 1.005–1.030)
pH: 5 (ref 5.0–8.0)

## 2024-10-24 LAB — SURGICAL PCR SCREEN
MRSA, PCR: NEGATIVE
Staphylococcus aureus: NEGATIVE

## 2024-10-24 NOTE — Patient Instructions (Addendum)
 Your procedure is scheduled on: Tuesday 10/31/24 Report to the Registration Desk on the 1st floor of the [facility]. To find out your arrival time, please call [phone] between 1PM - 3PM on: Monday 10/30/24 If your arrival time is 6:00 am, do not arrive before that time as the [facility] entrance doors do not open until 6:00 am.  REMEMBER: Instructions that are not followed completely may result in serious medical risk, up to and including death; or upon the discretion of your surgeon and anesthesiologist your surgery may need to be rescheduled.  Do not eat food after midnight the night before surgery.  No gum chewing or hard candies.  You may however, drink CLEAR liquids up to 2 hours before you are scheduled to arrive for your surgery. Do not drink anything within 2 hours of your scheduled arrival time.  Clear liquids include: - water  - apple juice without pulp - black coffee or tea (Do NOT add milk or creamers to the coffee or tea) Do NOT drink anything that is not on this list.   In addition, your doctor has ordered for you to drink the provided:  Ensure Pre-Surgery Clear Carbohydrate Drink  Drinking this carbohydrate drink up to two hours before surgery helps to reduce insulin resistance and improve patient outcomes. Please complete drinking 2 hours before scheduled arrival time.  One week prior to surgery: Stop Anti-inflammatories (NSAIDS) such as Advil, Aleve, Ibuprofen, Motrin, Naproxen, Naprosyn and Aspirin based products such as Excedrin, Goody's Powder, BC Powder. Stop ANY OVER THE COUNTER supplements until after surgery. Calcium Carb-Cholecalciferol (CALCIUM-VITAMIN D3  cyanocobalamin (VITAMIN B12)  Melatonin 10 MG  Multiple Vitamin (MULTIVITAMIN)   You may however, continue to take Tylenol  if needed for pain up until the day of surgery.   Continue taking all of your other prescription medications up until the day of surgery.  ON THE DAY OF SURGERY ONLY TAKE  THESE MEDICATIONS WITH SIPS OF WATER:  pantoprazole (PROTONIX) 40 MG  ezetimibe (ZETIA) 10 MG    No Alcohol for 24 hours before or after surgery.  No Smoking including e-cigarettes for 24 hours before surgery.  No chewable tobacco products for at least 6 hours before surgery.  No nicotine patches on the day of surgery.  Do not use any recreational drugs for at least a week (preferably 2 weeks) before your surgery.  Please be advised that the combination of cocaine and anesthesia may have negative outcomes, up to and including death. If you test positive for cocaine, your surgery will be cancelled.  On the morning of surgery brush your teeth with toothpaste and water, you may rinse your mouth with mouthwash if you wish. Do not swallow any toothpaste or mouthwash.  Use CHG Soap as directed on instruction sheet.  Do not wear jewelry, make-up, hairpins, clips or nail polish.  For welded (permanent) jewelry: bracelets, anklets, waist bands, etc.  Please have this removed prior to surgery.  If it is not removed, there is a chance that hospital personnel will need to cut it off on the day of surgery.  Do not wear lotions, powders, or perfumes.   Do not shave body hair from the neck down 48 hours before surgery.  Contact lenses, hearing aids and dentures may not be worn into surgery.  Do not bring valuables to the hospital. [facility] is not responsible for any missing/lost belongings or valuables.   Notify your doctor if there is any change in your medical condition (  cold, fever, infection).  Wear comfortable clothing (specific to your surgery type) to the hospital.  After surgery, you can help prevent lung complications by doing breathing exercises.  Take deep breaths and cough every 1-2 hours. Your doctor may order a device called an Incentive Spirometer to help you take deep breaths. When coughing or sneezing, hold a pillow firmly against your incision with both hands. This is  called splinting. Doing this helps protect your incision. It also decreases belly discomfort.  If you are being admitted to the hospital overnight, leave your suitcase in the car. After surgery it may be brought to your room.  In case of increased patient census, it may be necessary for you, the patient, to continue your postoperative care in the Same Day Surgery department.  If you are being discharged the day of surgery, you will not be allowed to drive home. You will need a responsible individual to drive you home and stay with you for 24 hours after surgery.   If you are taking public transportation, you will need to have a responsible individual with you.  Please call the Pre-admissions Testing Dept. at [phone] if you have any questions about these instructions.  Surgery Visitation Policy:  Patients having surgery or a procedure may have two visitors.  Children under the age of 85 must have an adult with them who is not the patient.  Inpatient Visitation:    Visiting hours are 7 a.m. to 8 p.m. Up to four visitors are allowed at one time in a patient room. The visitors may rotate out with other people during the day.  One visitor age 97 or older may stay with the patient overnight and must be in the room by 8 p.m.   Merchandiser, Retail to address health-related social needs:  [email or web address]    Pre-operative 4 CHG Bath Instructions   You can play a key role in reducing the risk of infection after surgery. Your skin needs to be as free of germs as possible. You can reduce the number of germs on your skin by washing with CHG (chlorhexidine  gluconate) soap before surgery. CHG is an antiseptic soap that kills germs and continues to kill germs even after washing.   DO NOT use if you have an allergy to chlorhexidine /CHG or antibacterial soaps. If your skin becomes reddened or irritated, stop using the CHG and notify one of our RNs at [phone].    Please shower with the CHG soap starting 4 days before surgery using the following schedule:     Please keep in mind the following:  DO NOT shave, including legs and underarms, starting the day of your first shower.   You may shave your face at any point before/day of surgery.  Place clean sheets on your bed the day you start using CHG soap. Use a clean washcloth (not used since being washed) for each shower. DO NOT sleep with pets once you start using the CHG.   CHG Shower Instructions:  If you choose to wash your hair and private area, wash first with your normal shampoo/soap.  After you use shampoo/soap, rinse your hair and body thoroughly to remove shampoo/soap residue.  Turn the water OFF and apply about 3 tablespoons (45 ml) of CHG soap to a CLEAN washcloth.  Apply CHG soap ONLY FROM YOUR NECK DOWN TO YOUR TOES (washing for 3-5 minutes)  DO NOT use CHG soap on face, private areas, open wounds, or sores.  Pay special attention to  the area where your surgery is being performed.  If you are having back surgery, having someone wash your back for you may be helpful. Wait 2 minutes after CHG soap is applied, then you may rinse off the CHG soap.  Pat dry with a clean towel  Put on clean clothes/pajamas   If you choose to wear lotion, please use ONLY the CHG-compatible lotions on the back of this paper.     Additional instructions for the day of surgery: DO NOT APPLY any lotions, deodorants, cologne, or perfumes.   Put on clean/comfortable clothes.  Brush your teeth.  Ask your nurse before applying any prescription medications to the skin.      CHG Compatible Lotions   Aveeno Moisturizing lotion  Cetaphil Moisturizing Cream  Cetaphil Moisturizing Lotion  Clairol Herbal Essence Moisturizing Lotion, Dry Skin  Clairol Herbal Essence Moisturizing Lotion, Extra Dry Skin  Clairol Herbal Essence Moisturizing Lotion, Normal Skin  Curel Age Defying Therapeutic Moisturizing Lotion  with Alpha Hydroxy  Curel Extreme Care Body Lotion  Curel Soothing Hands Moisturizing Hand Lotion  Curel Therapeutic Moisturizing Cream, Fragrance-Free  Curel Therapeutic Moisturizing Lotion, Fragrance-Free  Curel Therapeutic Moisturizing Lotion, Original Formula  Eucerin Daily Replenishing Lotion  Eucerin Dry Skin Therapy Plus Alpha Hydroxy Crme  Eucerin Dry Skin Therapy Plus Alpha Hydroxy Lotion  Eucerin Original Crme  Eucerin Original Lotion  Eucerin Plus Crme Eucerin Plus Lotion  Eucerin TriLipid Replenishing Lotion  Keri Anti-Bacterial Hand Lotion  Keri Deep Conditioning Original Lotion Dry Skin Formula Softly Scented  Keri Deep Conditioning Original Lotion, Fragrance Free Sensitive Skin Formula  Keri Lotion Fast Absorbing Fragrance Free Sensitive Skin Formula  Keri Lotion Fast Absorbing Softly Scented Dry Skin Formula  Keri Original Lotion  Keri Skin Renewal Lotion Keri Silky Smooth Lotion  Keri Silky Smooth Sensitive Skin Lotion  Nivea Body Creamy Conditioning Oil  Nivea Body Extra Enriched Lotion  Nivea Body Original Lotion  Nivea Body Sheer Moisturizing Lotion Nivea Crme  Nivea Skin Firming Lotion  NutraDerm 30 Skin Lotion  NutraDerm Skin Lotion  NutraDerm Therapeutic Skin Cream  NutraDerm Therapeutic Skin Lotion  ProShield Protective Hand Cream  Provon moisturizing lotion  How to Use an Incentive Spirometer  An incentive spirometer is a tool that measures how well you are filling your lungs with each breath. Learning to take long, deep breaths using this tool can help you keep your lungs clear and active. This may help to reverse or lessen your chance of developing breathing (pulmonary) problems, especially infection. You may be asked to use a spirometer: After a surgery. If you have a lung problem or a history of smoking. After a long period of time when you have been unable to move or be active. If the spirometer includes an indicator to show the highest number  that you have reached, your health care provider or respiratory therapist will help you set a goal. Keep a log of your progress as told by your health care provider. What are the risks? Breathing too quickly may cause dizziness or cause you to pass out. Take your time so you do not get dizzy or light-headed. If you are in pain, you may need to take pain medicine before doing incentive spirometry. It is harder to take a deep breath if you are having pain. How to use your incentive spirometer  Sit up on the edge of your bed or on a chair. Hold the incentive spirometer so that it is in an upright position. Before  you use the spirometer, breathe out normally. Place the mouthpiece in your mouth. Make sure your lips are closed tightly around it. Breathe in slowly and as deeply as you can through your mouth, causing the piston or the ball to rise toward the top of the chamber. Hold your breath for 3-5 seconds, or for as long as possible. If the spirometer includes a coach indicator, use this to guide you in breathing. Slow down your breathing if the indicator goes above the marked areas. Remove the mouthpiece from your mouth and breathe out normally. The piston or ball will return to the bottom of the chamber. Rest for a few seconds, then repeat the steps 10 or more times. Take your time and take a few normal breaths between deep breaths so that you do not get dizzy or light-headed. Do this every 1-2 hours when you are awake. If the spirometer includes a goal marker to show the highest number you have reached (best effort), use this as a goal to work toward during each repetition. After each set of 10 deep breaths, cough a few times. This will help to make sure that your lungs are clear. If you have an incision on your chest or abdomen from surgery, place a pillow or a rolled-up towel firmly against the incision when you cough. This can help to reduce pain while taking deep breaths and coughing. General  tips When you are able to get out of bed: Walk around often. Continue to take deep breaths and cough in order to clear your lungs. Keep using the incentive spirometer until your health care provider says it is okay to stop using it. If you have been in the hospital, you may be told to keep using the spirometer at home. Contact a health care provider if: You are having difficulty using the spirometer. You have trouble using the spirometer as often as instructed. Your pain medicine is not giving enough relief for you to use the spirometer as told. You have a fever. Get help right away if: You develop shortness of breath. You develop a cough with bloody mucus from the lungs. You have fluid or blood coming from an incision site after you cough. Summary An incentive spirometer is a tool that can help you learn to take long, deep breaths to keep your lungs clear and active. You may be asked to use a spirometer after a surgery, if you have a lung problem or a history of smoking, or if you have been inactive for a long period of time. Use your incentive spirometer as instructed every 1-2 hours while you are awake. If you have an incision on your chest or abdomen, place a pillow or a rolled-up towel firmly against your incision when you cough. This will help to reduce pain. Get help right away if you have shortness of breath, you cough up bloody mucus, or blood comes from your incision when you cough. This information is not intended to replace advice given to you by your health care provider. Make sure you discuss any questions you have with your health care provider. SABRA

## 2024-10-25 LAB — URINE CULTURE: Culture: NO GROWTH

## 2024-10-31 ENCOUNTER — Other Ambulatory Visit: Payer: Self-pay

## 2024-10-31 ENCOUNTER — Ambulatory Visit
Admission: RE | Admit: 2024-10-31 | Discharge: 2024-10-31 | Disposition: A | Discharge status: Home / Self Care | Source: Ambulatory Visit | Attending: Surgery | Admitting: Surgery

## 2024-10-31 ENCOUNTER — Ambulatory Visit

## 2024-10-31 ENCOUNTER — Encounter: Payer: Self-pay | Admitting: Surgery

## 2024-10-31 ENCOUNTER — Ambulatory Visit: Payer: Self-pay | Admitting: Urgent Care

## 2024-10-31 ENCOUNTER — Encounter
Admission: RE | Disposition: A | Discharge status: Home / Self Care | Payer: Self-pay | Source: Ambulatory Visit | Attending: Surgery

## 2024-10-31 DIAGNOSIS — K219 Gastro-esophageal reflux disease without esophagitis: Secondary | ICD-10-CM | POA: Insufficient documentation

## 2024-10-31 DIAGNOSIS — E7800 Pure hypercholesterolemia, unspecified: Secondary | ICD-10-CM | POA: Insufficient documentation

## 2024-10-31 DIAGNOSIS — R8271 Bacteriuria: Secondary | ICD-10-CM

## 2024-10-31 DIAGNOSIS — R8290 Unspecified abnormal findings in urine: Secondary | ICD-10-CM

## 2024-10-31 DIAGNOSIS — Z01812 Encounter for preprocedural laboratory examination: Secondary | ICD-10-CM

## 2024-10-31 DIAGNOSIS — Z79899 Other long term (current) drug therapy: Secondary | ICD-10-CM | POA: Insufficient documentation

## 2024-10-31 DIAGNOSIS — Z7982 Long term (current) use of aspirin: Secondary | ICD-10-CM | POA: Insufficient documentation

## 2024-10-31 DIAGNOSIS — K449 Diaphragmatic hernia without obstruction or gangrene: Secondary | ICD-10-CM | POA: Insufficient documentation

## 2024-10-31 DIAGNOSIS — Z6841 Body Mass Index (BMI) 40.0 and over, adult: Secondary | ICD-10-CM | POA: Insufficient documentation

## 2024-10-31 DIAGNOSIS — G4733 Obstructive sleep apnea (adult) (pediatric): Secondary | ICD-10-CM | POA: Insufficient documentation

## 2024-10-31 DIAGNOSIS — E66813 Obesity, class 3: Secondary | ICD-10-CM | POA: Insufficient documentation

## 2024-10-31 DIAGNOSIS — M1711 Unilateral primary osteoarthritis, right knee: Secondary | ICD-10-CM | POA: Insufficient documentation

## 2024-10-31 MED ORDER — PROPOFOL 1000 MG/100ML IV EMUL
INTRAVENOUS | Status: AC
  Filled 2024-10-31: qty 100

## 2024-10-31 MED ORDER — FENTANYL CITRATE (PF) 100 MCG/2ML IJ SOLN
INTRAMUSCULAR | Status: AC
  Filled 2024-10-31: qty 2

## 2024-10-31 MED ORDER — LACTATED RINGERS IV SOLN: INTRAVENOUS | Status: DC

## 2024-10-31 MED ORDER — BUPIVACAINE HCL (PF) 0.5 % IJ SOLN
INTRAMUSCULAR | Status: AC
  Filled 2024-10-31: qty 10

## 2024-10-31 MED ORDER — CEFAZOLIN SODIUM-DEXTROSE 3-4 GM/150ML-% IV SOLN
3.0000 g | Freq: Four times a day (QID) | INTRAVENOUS | Status: DC
  Administered 2024-10-31: 3 g via INTRAVENOUS
  Filled 2024-10-31 (×2): qty 150

## 2024-10-31 MED ORDER — DEXAMETHASONE SOD PHOSPHATE PF 10 MG/ML IJ SOLN
INTRAMUSCULAR | Status: DC | PRN
  Administered 2024-10-31: 10 mg via INTRAVENOUS

## 2024-10-31 MED ORDER — ONDANSETRON HCL 4 MG/2ML IJ SOLN
INTRAMUSCULAR | Status: AC
  Filled 2024-10-31: qty 2

## 2024-10-31 MED ORDER — LIDOCAINE HCL (CARDIAC) PF 100 MG/5ML IV SOSY
PREFILLED_SYRINGE | INTRAVENOUS | Status: DC | PRN
  Administered 2024-10-31: 100 mg via INTRAVENOUS

## 2024-10-31 MED ORDER — ACETAMINOPHEN 10 MG/ML IV SOLN
INTRAVENOUS | Status: AC
  Filled 2024-10-31: qty 100

## 2024-10-31 MED ORDER — LIDOCAINE HCL (CARDIAC) PF 100 MG/5ML IV SOSY: PREFILLED_SYRINGE | INTRAVENOUS | Status: DC | PRN

## 2024-10-31 MED ORDER — MIDAZOLAM HCL 2 MG/2ML IJ SOLN
INTRAMUSCULAR | Status: AC
  Filled 2024-10-31: qty 2

## 2024-10-31 MED ORDER — MIDAZOLAM HCL 5 MG/5ML IJ SOLN
INTRAMUSCULAR | Status: DC | PRN
  Administered 2024-10-31: 2 mg via INTRAVENOUS

## 2024-10-31 MED ORDER — APIXABAN 2.5 MG PO TABS
2.5000 mg | ORAL_TABLET | Freq: Two times a day (BID) | ORAL | 0 refills | Status: AC
  Filled 2024-10-31: qty 30, 15d supply, fill #0

## 2024-10-31 MED ORDER — BUPIVACAINE LIPOSOME 1.3 % IJ SUSP
INTRAMUSCULAR | Status: AC
  Filled 2024-10-31: qty 20

## 2024-10-31 MED ORDER — BUPIVACAINE HCL (PF) 0.5 % IJ SOLN
INTRAMUSCULAR | Status: DC | PRN
  Administered 2024-10-31: 3 mL

## 2024-10-31 MED ORDER — CEFAZOLIN SODIUM-DEXTROSE 2-4 GM/100ML-% IV SOLN
2.0000 g | INTRAVENOUS | Status: AC
  Administered 2024-10-31: 2 g via INTRAVENOUS

## 2024-10-31 MED ORDER — SODIUM CHLORIDE 0.9 % IV SOLN: INTRAVENOUS | Status: DC

## 2024-10-31 MED ORDER — TRIAMCINOLONE ACETONIDE 40 MG/ML IJ SUSP
INTRAMUSCULAR | Status: AC
  Filled 2024-10-31: qty 2

## 2024-10-31 MED ORDER — ORAL CARE MOUTH RINSE: 15.0000 mL | Freq: Once | OROMUCOSAL | Status: AC

## 2024-10-31 MED ORDER — TRANEXAMIC ACID-NACL 1000-0.7 MG/100ML-% IV SOLN
1000.0000 mg | INTRAVENOUS | Status: AC
  Administered 2024-10-31: 1000 mg via INTRAVENOUS

## 2024-10-31 MED ORDER — BUPIVACAINE-EPINEPHRINE (PF) 0.5% -1:200000 IJ SOLN
INTRAMUSCULAR | Status: AC
  Filled 2024-10-31: qty 30

## 2024-10-31 MED ORDER — CHLORHEXIDINE GLUCONATE 0.12 % MT SOLN
15.0000 mL | Freq: Once | OROMUCOSAL | Status: AC
  Administered 2024-10-31: 15 mL via OROMUCOSAL

## 2024-10-31 MED ORDER — BUPIVACAINE-EPINEPHRINE (PF) 0.5% -1:200000 IJ SOLN
INTRAMUSCULAR | Status: DC | PRN
  Administered 2024-10-31: 30 mL

## 2024-10-31 MED ORDER — TRIAMCINOLONE ACETONIDE 40 MG/ML IJ SUSP
INTRAMUSCULAR | Status: DC | PRN
  Administered 2024-10-31: 80 mg via INTRAMUSCULAR

## 2024-10-31 MED ORDER — ACETAMINOPHEN 325 MG PO TABS: 325.0000 mg | ORAL_TABLET | Freq: Four times a day (QID) | ORAL | Status: DC | PRN

## 2024-10-31 MED ORDER — ONDANSETRON HCL 4 MG/2ML IJ SOLN
INTRAMUSCULAR | Status: DC | PRN
  Administered 2024-10-31: 4 mg via INTRAVENOUS

## 2024-10-31 MED ORDER — CHLORHEXIDINE GLUCONATE 0.12 % MT SOLN
OROMUCOSAL | Status: AC
  Filled 2024-10-31: qty 15

## 2024-10-31 MED ORDER — KETOROLAC TROMETHAMINE 15 MG/ML IJ SOLN
INTRAMUSCULAR | Status: AC
  Filled 2024-10-31: qty 1

## 2024-10-31 MED ORDER — PROPOFOL 10 MG/ML IV BOLUS
INTRAVENOUS | Status: DC | PRN
  Administered 2024-10-31: 40 mg via INTRAVENOUS

## 2024-10-31 MED ORDER — SODIUM CHLORIDE (PF) 0.9 % IJ SOLN
INTRAMUSCULAR | Status: AC
  Filled 2024-10-31: qty 40

## 2024-10-31 MED ORDER — TRANEXAMIC ACID-NACL 1000-0.7 MG/100ML-% IV SOLN
INTRAVENOUS | Status: AC
  Filled 2024-10-31: qty 100

## 2024-10-31 MED ORDER — ONDANSETRON HCL 4 MG/2ML IJ SOLN: 4.0000 mg | Freq: Four times a day (QID) | INTRAMUSCULAR | Status: DC | PRN

## 2024-10-31 MED ORDER — APIXABAN 2.5 MG PO TABS: 2.5000 mg | ORAL_TABLET | Freq: Two times a day (BID) | ORAL | 0 refills | Status: AC

## 2024-10-31 MED ORDER — OXYCODONE HCL 5 MG PO TABS: 5.0000 mg | ORAL_TABLET | ORAL | Status: DC | PRN

## 2024-10-31 MED ORDER — OXYCODONE HCL 5 MG/5ML PO SOLN: 5.0000 mg | Freq: Once | ORAL | Status: AC | PRN

## 2024-10-31 MED ORDER — PROPOFOL 10 MG/ML IV BOLUS
INTRAVENOUS | Status: AC
  Filled 2024-10-31: qty 20

## 2024-10-31 MED ORDER — DEXAMETHASONE SOD PHOSPHATE PF 10 MG/ML IJ SOLN
INTRAMUSCULAR | Status: AC
  Filled 2024-10-31: qty 1

## 2024-10-31 MED ORDER — METOCLOPRAMIDE HCL 5 MG/ML IJ SOLN: 5.0000 mg | Freq: Three times a day (TID) | INTRAMUSCULAR | Status: DC | PRN

## 2024-10-31 MED ORDER — SODIUM CHLORIDE 0.9 % IV SOLN
INTRAVENOUS | Status: DC | PRN
  Administered 2024-10-31: 60 mL

## 2024-10-31 MED ORDER — FENTANYL CITRATE (PF) 100 MCG/2ML IJ SOLN
INTRAMUSCULAR | Status: DC | PRN
  Administered 2024-10-31 (×2): 50 ug via INTRAVENOUS

## 2024-10-31 MED ORDER — FENTANYL CITRATE (PF) 100 MCG/2ML IJ SOLN: 25.0000 ug | INTRAMUSCULAR | Status: DC | PRN

## 2024-10-31 MED ORDER — ESMOLOL HCL 100 MG/10ML IV SOLN
INTRAVENOUS | Status: DC | PRN
  Administered 2024-10-31: 20 mg via INTRAVENOUS
  Administered 2024-10-31: 10 mg via INTRAVENOUS
  Administered 2024-10-31: 20 mg via INTRAVENOUS

## 2024-10-31 MED ORDER — OXYCODONE HCL 5 MG PO TABS
5.0000 mg | ORAL_TABLET | Freq: Once | ORAL | Status: AC | PRN
  Administered 2024-10-31: 5 mg via ORAL

## 2024-10-31 MED ORDER — OXYCODONE HCL 5 MG PO TABS
ORAL_TABLET | ORAL | Status: AC
  Filled 2024-10-31: qty 1

## 2024-10-31 MED ORDER — ONDANSETRON HCL 4 MG PO TABS: 4.0000 mg | ORAL_TABLET | Freq: Four times a day (QID) | ORAL | Status: DC | PRN

## 2024-10-31 MED ORDER — ACETAMINOPHEN 10 MG/ML IV SOLN
INTRAVENOUS | Status: DC | PRN
  Administered 2024-10-31: 1000 mg via INTRAVENOUS

## 2024-10-31 MED ORDER — SODIUM CHLORIDE 0.9 % BOLUS PEDS
250.0000 mL | Freq: Once | INTRAVENOUS | Status: AC
  Administered 2024-10-31: 250 mL via INTRAVENOUS

## 2024-10-31 MED ORDER — SODIUM CHLORIDE 0.9 % IR SOLN
Status: DC | PRN
  Administered 2024-10-31: 3000 mL

## 2024-10-31 MED ORDER — DEXMEDETOMIDINE HCL IN NACL 80 MCG/20ML IV SOLN
INTRAVENOUS | Status: DC | PRN
  Administered 2024-10-31: 12 ug via INTRAVENOUS

## 2024-10-31 MED ORDER — CEFAZOLIN SODIUM-DEXTROSE 2-4 GM/100ML-% IV SOLN
INTRAVENOUS | Status: AC
  Filled 2024-10-31: qty 100

## 2024-10-31 MED ORDER — METOCLOPRAMIDE HCL 10 MG PO TABS: 5.0000 mg | ORAL_TABLET | Freq: Three times a day (TID) | ORAL | Status: DC | PRN

## 2024-10-31 MED ORDER — KETOROLAC TROMETHAMINE 30 MG/ML IJ SOLN
INTRAMUSCULAR | Status: DC | PRN
  Administered 2024-10-31: 30 mg via INTRAMUSCULAR

## 2024-10-31 MED ORDER — OXYCODONE HCL 5 MG PO TABS
5.0000 mg | ORAL_TABLET | ORAL | 0 refills | Status: AC | PRN
  Filled 2024-10-31: qty 40, 7d supply, fill #0

## 2024-10-31 MED ORDER — PROPOFOL 500 MG/50ML IV EMUL
INTRAVENOUS | Status: DC | PRN
  Administered 2024-10-31: 75 ug/kg/min via INTRAVENOUS

## 2024-10-31 MED ORDER — KETOROLAC TROMETHAMINE 15 MG/ML IJ SOLN
15.0000 mg | Freq: Once | INTRAMUSCULAR | Status: AC
  Administered 2024-10-31: 15 mg via INTRAVENOUS

## 2024-10-31 MED ORDER — OXYCODONE HCL 5 MG PO TABS: 5.0000 mg | ORAL_TABLET | ORAL | 0 refills | Status: AC | PRN

## 2024-10-31 MED ORDER — LIDOCAINE HCL (PF) 2 % IJ SOLN
INTRAMUSCULAR | Status: AC
  Filled 2024-10-31: qty 5

## 2024-10-31 NOTE — Progress Notes (Signed)
 The beneficiary has a mobility limitation that significantly impairs his/her ability to participate in one or more mobility-related activities of daily living (MRADL) in the home. The patient is able to safely use the walker. The functional mobility deficit can be sufficiently resolved by use of a rolling walker.

## 2024-10-31 NOTE — Transfer of Care (Signed)
 Immediate Anesthesia Transfer of Care Note  Patient: Shari Parks  Procedure(s) Performed: ARTHROPLASTY, KNEE, TOTAL (Right: Knee)  Patient Location: PACU  Anesthesia Type:Spinal  Level of Consciousness: awake, alert , and oriented  Airway & Oxygen Therapy: Patient Spontanous Breathing  Post-op Assessment: Report given to RN and Post -op Vital signs reviewed and stable  Post vital signs: Reviewed and stable  Last Vitals:  Vitals Value Taken Time  BP 119/62   Temp    Pulse 66 10/31/24 12:42  Resp 16 10/31/24 12:42  SpO2 95 % 10/31/24 12:42  Vitals shown include unfiled device data.  Last Pain:  Vitals:   10/31/24 0831  TempSrc: Temporal  PainSc: 5          Complications: No notable events documented.

## 2024-10-31 NOTE — Op Note (Signed)
 10/31/2024  12:35 PM  Patient:   Shari Parks  Pre-Op Diagnosis:   Degenerative joint disease, right knee.  Post-Op Diagnosis:   Same  Procedure:   Right TKA using all-pressfit Zimmer Persona system with a #7 PCR femur, a(n) E-sized  tibial tray with a 13 mmmedial congruent E-poly insert, and a 9 x 29 mm all-poly 3-pegged domed patella.  Surgeon:   DOROTHA Reyes Maltos, MD  Assistant:   Gustavo Level, PA-C   Anesthesia:   Spinal  Findings:   As above  Complications:   None  EBL:   25 cc  Fluids:   350 cc crystalloid  UOP:   None  TT:   80 minutes at 300 mmHg  Drains:   None  Closure:   Staples  Implants:   As above  Brief Clinical Note:   The patient is a 59 year old female with a long history of progressively worsening right knee pain. The patient's symptoms have progressed despite medications, activity modification, injections, etc. The patient's history and examination were consistent with advanced degenerative joint disease of the right knee confirmed by plain radiographs. The patient presents at this time for a right total knee arthroplasty.  Procedure:   The patient was brought into the operating room. After adequate spinal anesthesia was obtained, the patient was repositioned in the supine position on the operating room table. The right lower extremity was prepped with ChloraPrep solution and draped sterilely. Preoperative antibiotics were administered. A timeout was performed to verify the appropriate surgical site before the limb was exsanguinated with an Esmarch and the tourniquet inflated to 300 mmHg.   A standard anterior approach to the knee was made through an approximately 6-7 inch incision. The incision was carried down through the subcutaneous tissues to expose superficial retinaculum. This was split the length of the incision and the medial flap elevated sufficiently to expose the medial retinaculum. The medial retinaculum was incised, leaving a 3-4 mm cuff of  tissue on the patella. This was extended distally along the medial border of the patellar tendon and proximally through the medial third of the quadriceps tendon. A subtotal fat pad excision was performed before the soft tissues were elevated off the anteromedial and anterolateral aspects of the proximal tibia to the level of the collateral ligaments. The anterior portions of the medial and lateral menisci were removed, as was the anterior cruciate ligament. With the knee flexed to 90, the external tibial guide was positioned and the appropriate proximal tibial cut made. This piece was taken to the back table where it was measured and found to be optimally replicated by a(n) E-sized component.  Attention was directed to the distal femur. The intramedullary canal was accessed through a 3/8 drill hole. The intramedullary guide was inserted and placed at 5 of valgus alignment. Using the +0 slot, the distal cut was made. The distal femur was measured and found to be optimally replicated by the #7 component. The #7 4-in-1 cutting block was positioned and first the posterior, then the posterior chamfer, the anterior, and finally the anterior chamfer cuts were made after verifying that the anterior cortex would not be notched.   At this point, the posterior portions medial and lateral menisci were removed. A trial reduction was performed using the appropriate femoral and tibial components with first the 10 mm, then the 12 mm, and finally the 13 mm insert. The 13 mm insert demonstrated excellent stability to varus and valgus stressing both in flexion and extension while  permitting full extension. Patellar tracking was assessed and found to be excellent. The tibial trial position was marked on the proximal tibia. The patella thickness was measured and found to be 19 mm, so the appropriate cut was made. The patellar surface was measured and found to be optimally replicated by the 29 mm component. The three peg holes  were drilled in place before the trial button was inserted. Patella tracking was assessed and found to be excellent, passing the no thumb test. The lug holes were drilled into the distal femur before the trial component was removed.  The tibial tray was repositioned before the keel was created using the appropriate tower, drills, and punch.  The bony surfaces were prepared for implantation by irrigating them thoroughly with sterile saline solution via the jet lavage system. A bone plug was fashioned from some of the bone that had been removed previously and used to plug the distal femoral canal. In addition, a cocktail of 20 cc of Exparel , 30 cc of 0.5% Sensorcaine , 2 cc of Kenalog  40 (80 mg), and 30 mg of Toradol  diluted out to 90 cc with normal saline was injected into the postero-medial and postero-lateral aspects of the knee, the medial and lateral gutter regions, and the peri-incisional tissues to help with postoperative analgesia.    The tibial tray was impacted into place first with care taken to be sure the component was fully seated. Next, the femoral component was impacted into place again with care taken to be sure that the component was fully improperly seated. The permanent 13 mm medial congruent E-polyethylene insert was snapped into place with care taken to ensure appropriate locking of the insert. Finally, the patella was positioned and compressed into place using the patellar clamp. Again, care was taken to be sure that the component was fully seated. The knee was placed through a range of motion with the findings as described above.    The wound was copiously irrigated with sterile saline solution using the jet lavage system before the quadriceps tendon and retinacular layer were reapproximated using #0 Vicryl interrupted sutures. The superficial retinacular layer also was closed using a running #0 Vicryl suture. The subcutaneous tissues were closed in several layers using 2-0 Vicryl  interrupted sutures. The skin was closed using staples. A sterile honeycomb dressing was applied to the skin before the leg was wrapped with an Ace wrap to accommodate the Polar Care device. The patient was then awakened and returned to the recovery room in satisfactory condition after tolerating the procedure well.

## 2024-11-01 ENCOUNTER — Encounter: Payer: Self-pay | Admitting: Surgery

## 2024-11-02 NOTE — Anesthesia Postprocedure Evaluation (Signed)
"   Anesthesia Post Note  Patient: Shari Parks  Procedure(s) Performed: ARTHROPLASTY, KNEE, TOTAL (Right: Knee)  Patient location during evaluation: PACU Anesthesia Type: Spinal Level of consciousness: oriented and awake and alert Pain management: pain level controlled Vital Signs Assessment: post-procedure vital signs reviewed and stable Respiratory status: spontaneous breathing, respiratory function stable and patient connected to nasal cannula oxygen Cardiovascular status: blood pressure returned to baseline and stable Postop Assessment: no headache, no backache and no apparent nausea or vomiting Anesthetic complications: no   No notable events documented.   Last Vitals:  Vitals:   10/31/24 1510 10/31/24 1741  BP: 125/76 130/62  Pulse: 73 82  Resp: 16 20  Temp: 36.6 C 36.4 C  SpO2: 98% 97%    Last Pain:  Vitals:   11/01/24 0923  TempSrc:   PainSc: 2                  [name]      "
